# Patient Record
Sex: Female | Born: 1955 | Race: Black or African American | Hispanic: No | Marital: Married | Smoking: Former smoker
Health system: Southern US, Community
[De-identification: ages and names within clinical notes are randomized; demographics above are authoritative.]

## PROBLEM LIST (undated history)

## (undated) DIAGNOSIS — F32A Depression, unspecified: Secondary | ICD-10-CM

## (undated) DIAGNOSIS — I10 Essential (primary) hypertension: Secondary | ICD-10-CM

## (undated) DIAGNOSIS — G4730 Sleep apnea, unspecified: Secondary | ICD-10-CM

## (undated) DIAGNOSIS — F329 Major depressive disorder, single episode, unspecified: Secondary | ICD-10-CM

## (undated) DIAGNOSIS — E039 Hypothyroidism, unspecified: Secondary | ICD-10-CM

## (undated) DIAGNOSIS — E785 Hyperlipidemia, unspecified: Secondary | ICD-10-CM

## (undated) DIAGNOSIS — R0602 Shortness of breath: Secondary | ICD-10-CM

## (undated) DIAGNOSIS — Z9889 Other specified postprocedural states: Secondary | ICD-10-CM

## (undated) DIAGNOSIS — M1990 Unspecified osteoarthritis, unspecified site: Secondary | ICD-10-CM

## (undated) DIAGNOSIS — R112 Nausea with vomiting, unspecified: Secondary | ICD-10-CM

## (undated) DIAGNOSIS — J449 Chronic obstructive pulmonary disease, unspecified: Secondary | ICD-10-CM

## (undated) HISTORY — PX: LAPAROSCOPIC GASTRIC BANDING: SHX1100

## (undated) HISTORY — DX: Hyperlipidemia, unspecified: E78.5

## (undated) HISTORY — DX: Essential (primary) hypertension: I10

## (undated) HISTORY — PX: KNEE SURGERY: SHX244

## (undated) HISTORY — PX: TUBAL LIGATION: SHX77

## (undated) HISTORY — PX: EYE SURGERY: SHX253

---

## 2009-06-16 ENCOUNTER — Ambulatory Visit (HOSPITAL_COMMUNITY): Admission: RE | Admit: 2009-06-16 | Discharge: 2009-06-16 | Payer: Self-pay | Admitting: Surgery

## 2009-06-23 ENCOUNTER — Ambulatory Visit (HOSPITAL_COMMUNITY): Admission: RE | Admit: 2009-06-23 | Discharge: 2009-06-23 | Payer: Self-pay | Admitting: Surgery

## 2009-12-12 ENCOUNTER — Encounter: Admission: RE | Admit: 2009-12-12 | Discharge: 2010-01-21 | Payer: Self-pay | Admitting: Surgery

## 2010-01-19 ENCOUNTER — Ambulatory Visit (HOSPITAL_COMMUNITY): Admission: RE | Admit: 2010-01-19 | Discharge: 2010-01-20 | Payer: Self-pay | Admitting: Surgery

## 2010-02-02 ENCOUNTER — Encounter
Admission: RE | Admit: 2010-02-02 | Discharge: 2010-02-02 | Payer: Self-pay | Source: Home / Self Care | Attending: Surgery | Admitting: Surgery

## 2010-03-16 ENCOUNTER — Encounter
Admission: RE | Admit: 2010-03-16 | Discharge: 2010-05-25 | Payer: Self-pay | Source: Home / Self Care | Attending: Surgery | Admitting: Surgery

## 2010-05-31 ENCOUNTER — Encounter: Payer: Medicare Other | Attending: Surgery | Admitting: *Deleted

## 2010-05-31 DIAGNOSIS — Z9884 Bariatric surgery status: Secondary | ICD-10-CM | POA: Insufficient documentation

## 2010-05-31 DIAGNOSIS — Z09 Encounter for follow-up examination after completed treatment for conditions other than malignant neoplasm: Secondary | ICD-10-CM | POA: Insufficient documentation

## 2010-05-31 DIAGNOSIS — Z713 Dietary counseling and surveillance: Secondary | ICD-10-CM | POA: Insufficient documentation

## 2010-07-08 LAB — DIFFERENTIAL
Basophils Absolute: 0 10*3/uL (ref 0.0–0.1)
Eosinophils Absolute: 0.1 10*3/uL (ref 0.0–0.7)
Eosinophils Relative: 0 % (ref 0–5)
Eosinophils Relative: 2 % (ref 0–5)
Lymphocytes Relative: 10 % — ABNORMAL LOW (ref 12–46)
Lymphs Abs: 0.8 10*3/uL (ref 0.7–4.0)
Lymphs Abs: 1.7 10*3/uL (ref 0.7–4.0)
Monocytes Absolute: 0.4 10*3/uL (ref 0.1–1.0)
Monocytes Absolute: 0.5 10*3/uL (ref 0.1–1.0)
Monocytes Relative: 6 % (ref 3–12)
Monocytes Relative: 8 % (ref 3–12)

## 2010-07-08 LAB — COMPREHENSIVE METABOLIC PANEL
AST: 20 U/L (ref 0–37)
Albumin: 3.9 g/dL (ref 3.5–5.2)
CO2: 27 mEq/L (ref 19–32)
Calcium: 9.6 mg/dL (ref 8.4–10.5)
Creatinine, Ser: 0.77 mg/dL (ref 0.4–1.2)
Glucose, Bld: 332 mg/dL — ABNORMAL HIGH (ref 70–99)

## 2010-07-08 LAB — CBC
HCT: 33.8 % — ABNORMAL LOW (ref 36.0–46.0)
HCT: 37.2 % (ref 36.0–46.0)
Hemoglobin: 11.3 g/dL — ABNORMAL LOW (ref 12.0–15.0)
Hemoglobin: 12.3 g/dL (ref 12.0–15.0)
MCH: 28.9 pg (ref 26.0–34.0)
MCV: 87.4 fL (ref 78.0–100.0)
RBC: 3.91 MIL/uL (ref 3.87–5.11)
RDW: 15.1 % (ref 11.5–15.5)
WBC: 5.3 10*3/uL (ref 4.0–10.5)

## 2010-07-08 LAB — GLUCOSE, CAPILLARY
Glucose-Capillary: 229 mg/dL — ABNORMAL HIGH (ref 70–99)
Glucose-Capillary: 253 mg/dL — ABNORMAL HIGH (ref 70–99)

## 2010-07-08 LAB — PREGNANCY, URINE: Preg Test, Ur: NEGATIVE

## 2010-07-08 LAB — SURGICAL PCR SCREEN: Staphylococcus aureus: NEGATIVE

## 2010-08-02 ENCOUNTER — Encounter: Payer: Medicare Other | Attending: Surgery | Admitting: *Deleted

## 2010-08-02 DIAGNOSIS — Z09 Encounter for follow-up examination after completed treatment for conditions other than malignant neoplasm: Secondary | ICD-10-CM | POA: Insufficient documentation

## 2010-08-02 DIAGNOSIS — Z9884 Bariatric surgery status: Secondary | ICD-10-CM | POA: Insufficient documentation

## 2010-08-02 DIAGNOSIS — Z713 Dietary counseling and surveillance: Secondary | ICD-10-CM | POA: Insufficient documentation

## 2010-09-21 ENCOUNTER — Encounter (INDEPENDENT_AMBULATORY_CARE_PROVIDER_SITE_OTHER): Payer: Self-pay | Admitting: Surgery

## 2010-11-01 ENCOUNTER — Encounter: Payer: Self-pay | Admitting: *Deleted

## 2010-11-01 ENCOUNTER — Encounter: Payer: Medicare Other | Attending: Surgery | Admitting: *Deleted

## 2010-11-01 DIAGNOSIS — Z09 Encounter for follow-up examination after completed treatment for conditions other than malignant neoplasm: Secondary | ICD-10-CM | POA: Insufficient documentation

## 2010-11-01 DIAGNOSIS — Z9884 Bariatric surgery status: Secondary | ICD-10-CM | POA: Insufficient documentation

## 2010-11-01 DIAGNOSIS — Z713 Dietary counseling and surveillance: Secondary | ICD-10-CM | POA: Insufficient documentation

## 2010-11-01 NOTE — Progress Notes (Signed)
Follow-up visit: 10 Months Post-Operative LAGB Surgery  Medical Nutrition Therapy:  Appt start time: 9:00am; Appt end time:  9:30am  Assessment:  Primary concerns today: post-operative bariatric surgery nutrition management.  Weight today: 275.8lbs Weight change: 7.4lbs lost Total weight lost: 32.3 lbs lost total BMI: 39.6%  24-hr recall:  B : SKIPS, related to limited appetite Snk: SKIPS, related to limited appetite  L (1PM): Hot dog with whole wheat bun (2) Snk: SKIPS, related to limited appetite D:  SKIPS, related to limited appetite Snk (8-11:30 PM): Sherbet (2-3 cups) or Sugar-free popsicles   Fluid intake: Iced coffee (with sugar), iced tea (diet), water = 20oz total  Estimated total protein intake: 30-40g  Medications: Wellbutrin, Simvastatin, Lantus, Metformin, Enanapril, Aspirin Supplementation: No supplementation reported CBG monitoring: 1 time per day Average CBG per patient: >200 Last patient reported A1c: >13  Using straws:None reported Drinking while eating: With every meal Hair loss: None reported Carbonated beverages: None reported N/V/D/C: Vomiting reported secondary to poor chewing habits, eating too fast Dumping syndrome:N/A Last LAGB fill: >3 months ago Recent physical activity: Limited related feeling "extremely fatigued" and "too hot"  Progress Towards Goal(s): No progress reported.   Nutritional Diagnosis:  Disordered eating pattern related to frequent meal skipping and excessive late night snacking as evidenced by patient consuming ~1 meal per day.     Intervention:    Discontinue drinking while eating  Increase protein to 80g/day using lean meats, beans, and protein supplements  Increase fluid to 64oz (sugar-free, caffeine-free, non-carbonated)  Increase exercise levels to 2-3 times per week  Avoid concentrated sweets  Eat 3 meals/day  Restart Pre-Op Diet  Monitoring/Evaluation:  Dietary intake, exercise, lap band fills, and body  weight. Follow up in 2 months for 12 month post-op visit.

## 2010-11-01 NOTE — Patient Instructions (Addendum)
New patient set goals:  Avoid drinking while eating  Increase protein (80g) and fluid (64oz) to goal  Increase exercise levels  Avoid concentrated sweets  Eat 3 meals/day  Restart Pre-Op Diet  Follow up in 2 months for 12 month post-op visit.

## 2010-12-03 ENCOUNTER — Encounter (INDEPENDENT_AMBULATORY_CARE_PROVIDER_SITE_OTHER): Payer: Self-pay

## 2010-12-31 ENCOUNTER — Encounter (INDEPENDENT_AMBULATORY_CARE_PROVIDER_SITE_OTHER): Payer: Self-pay

## 2010-12-31 ENCOUNTER — Ambulatory Visit (INDEPENDENT_AMBULATORY_CARE_PROVIDER_SITE_OTHER): Payer: Medicare Other | Admitting: Physician Assistant

## 2010-12-31 DIAGNOSIS — R1310 Dysphagia, unspecified: Secondary | ICD-10-CM

## 2010-12-31 NOTE — Patient Instructions (Signed)
Obtain x-ray then follow-up with me in 2 weeks.

## 2010-12-31 NOTE — Progress Notes (Signed)
  HISTORY: Nicole Bridges is a 55 y.o.female who received an AP-Large lap-band in September 2011 by Dr. Daphine Deutscher. She was last seen in April and has gained five pounds. She complains of extreme hunger at night but is eating a diet of slider foods as she has an intolerance of solids at any time of day.  VITAL SIGNS: Filed Vitals:   12/31/10 1027  BP: 134/76  Pulse: 88    PHYSICAL EXAM: Physical exam reveals a very well-appearing 55 y.o.female in no apparent distress Neurologic: Awake, alert, oriented Psych: Bright affect, conversant Respiratory: Breathing even and unlabored. No stridor or wheezing Extremities: Atraumatic, good range of motion. Skin: Warm, Dry, no rashes Musculoskeletal: Normal gait, Joints normal  ASSESMENT: 55 y.o.  female  s/p AP-Large lap-band.   PLAN: I've scheduled her for an upper GI next week with follow-up after her study.

## 2011-01-03 ENCOUNTER — Ambulatory Visit
Admission: RE | Admit: 2011-01-03 | Discharge: 2011-01-03 | Disposition: A | Discharge status: Home / Self Care | Payer: Medicare Other | Source: Ambulatory Visit | Attending: Physician Assistant | Admitting: Physician Assistant

## 2011-01-03 ENCOUNTER — Encounter: Payer: Self-pay | Admitting: *Deleted

## 2011-01-03 ENCOUNTER — Encounter: Payer: Medicare Other | Attending: Surgery | Admitting: *Deleted

## 2011-01-03 DIAGNOSIS — Z09 Encounter for follow-up examination after completed treatment for conditions other than malignant neoplasm: Secondary | ICD-10-CM | POA: Insufficient documentation

## 2011-01-03 DIAGNOSIS — Z9884 Bariatric surgery status: Secondary | ICD-10-CM | POA: Insufficient documentation

## 2011-01-03 DIAGNOSIS — Z713 Dietary counseling and surveillance: Secondary | ICD-10-CM | POA: Insufficient documentation

## 2011-01-03 NOTE — Progress Notes (Signed)
  Follow-up visit: 12 Months Post-Operative LAGB Surgery  Medical Nutrition Therapy:  Appt start time: 0900 end time:  0930.  Assessment:  Primary concerns today: post-operative bariatric surgery nutrition management. Pt reports that she has not been able to get a fill recently due to the fact that her co-pay for fills is too high. On another note she states that many foods "get stuck" and "come back up". When discussing meals, Nicole Bridges reports that she doesn't chew well and eats fairly fast. Nicole Bridges is upset with her weight gain today but feels its related to her band not working right. She went to have any x-ray today to see if the band had slipped yet per Nicole Bridges she reports that everything is okay. Nicole Bridges reports that she is severely depressed today. She says her family doesn't understand and it is interfering with her quality of life. She reports no suicidal ideations.  Weight today: 291.6 lbs Weight change: 16.6 lbs gain Total weight lost: No net weight loss BMI: 41.9% Weight goal: 150 % Weight goal met: 0%  24-hr recall:  B (7-8 AM): Skips Snk (AM): Skips OR Glucerna Shake  L (2 PM): 2 Hot Dogs (w/ bun), baked beans Snk (3-4 PM): Cucumbers w/ Low-fat Ranch D (4-5 PM): Skips Snk (8-10 PM): Cheese, crackers, Popsicles, Ice Cream (mindless eating)  Fluid intake: 45-50 oz (Pepsi, Juices, Water, Diabetes Shakes) Estimated total protein intake: 40-50  Medications: No medication changes (See list) Supplementation: Pt is not taking medications regularly  CBG monitoring: Checking once/day Average CBG per patient: 200 + Last patient reported A1c: 13% (per pt on 10/2010) No results found for this basename: HGBA1C   Using straws: Yes Drinking while eating: "Yes, I have to or food will get stuck" Hair loss: Yes Carbonated beverages: Yes N/V/D/C: Yes Last Lap-Band fill: >3 months ago  Recent physical activity:  No. When asked about exercise, pt reports that she is depressed because she is  not working, does not have any friends, and doesn't have any reason to exercise.  Progress Towards Goal(s):  In progress.  Nutritional Diagnosis:  [place]-3.3 Overweight/obesity As related to excessive carbohydrate intake.  As evidenced by pt consuming excessive amounts of sugar-sweetened beverages, starchy foods, and sweets.    Intervention:  Nutrition education.  Monitoring/Evaluation:  Dietary intake, exercise, lap band fills, and body weight. Follow up in 3 months for 15 month post-op visit.

## 2011-01-03 NOTE — Patient Instructions (Addendum)
Goals:  Follow Phase 3B: High Protein + Non-Starchy Vegetables  Restart Pre-Op Diet  Avoid Meal Skipping  Avoid sugar-sweetened beverages  Eat 3-6 small meals/snacks, every 3-5 hrs  Increase lean protein foods to meet 60-80g goal  Increase fluid intake to 64oz +  Avoid drinking 15 minutes before, during and 30 minutes after eating  Aim for >30 min of physical activity daily  *Referred pt back to PCP regarding depression.

## 2011-01-14 ENCOUNTER — Encounter (INDEPENDENT_AMBULATORY_CARE_PROVIDER_SITE_OTHER): Payer: Medicare Other

## 2011-01-14 ENCOUNTER — Ambulatory Visit (INDEPENDENT_AMBULATORY_CARE_PROVIDER_SITE_OTHER): Payer: Medicare Other | Admitting: Physician Assistant

## 2011-01-14 ENCOUNTER — Encounter (INDEPENDENT_AMBULATORY_CARE_PROVIDER_SITE_OTHER): Payer: Self-pay

## 2011-01-14 NOTE — Patient Instructions (Signed)
Follow-up with Dr. Daphine Deutscher and your primary care physician as scheduled.

## 2011-01-14 NOTE — Progress Notes (Signed)
  HISTORY: Nicole Bridges is a 55 y.o.female who received an AP-Large lap-band in September 2011 by Dr. Daphine Deutscher. She comes in for follow-up after her Upper GI last week. We reviewed the study results as well as the images which were completely normal. She also met with Nicole Bridges, LD to go over her diet choices, which have been poor at best. She also appears to have some family/social stressors which are not helping matters. She has an appointment with Dr. Nelson Chimes to address some of these problems.  VITAL SIGNS: Filed Vitals:   01/14/11 1338  BP: 124/82  Pulse: 76  Temp: 98.4 F (36.9 C)  Resp: 18    PHYSICAL EXAM: Physical exam reveals a very well-appearing 55 y.o.female in no apparent distress Neurologic: Awake, alert, oriented Psych: Bright affect, conversant Respiratory: Breathing even and unlabored. No stridor or wheezing Extremities: Atraumatic, good range of motion. Skin: Warm, Dry, no rashes Musculoskeletal: Normal gait, Joints normal  ASSESMENT: 55 y.o.  female  s/p AP-Large lap-band.   PLAN: From a surgical standpoint, she appears to be in good shape. The band is functioning as expected. Most of her issues appear to be with regard to food choices. A significant barrier appears to be motivation to make good choices. She has an appointment with her primary physician in early October. We'll set her up with Dr. Daphine Deutscher next month as well.

## 2011-02-25 ENCOUNTER — Ambulatory Visit
Admission: RE | Admit: 2011-02-25 | Discharge: 2011-02-25 | Disposition: A | Discharge status: Home / Self Care | Payer: Medicare Other | Source: Ambulatory Visit | Attending: Surgery | Admitting: Surgery

## 2011-02-25 ENCOUNTER — Encounter (INDEPENDENT_AMBULATORY_CARE_PROVIDER_SITE_OTHER): Payer: Self-pay | Admitting: Surgery

## 2011-02-25 ENCOUNTER — Ambulatory Visit (INDEPENDENT_AMBULATORY_CARE_PROVIDER_SITE_OTHER): Payer: Medicare Other | Admitting: Surgery

## 2011-02-25 VITALS — BP 130/74 | HR 78 | Temp 97.2°F | Resp 20 | Ht 69.0 in | Wt 292.8 lb

## 2011-02-25 DIAGNOSIS — Z9884 Bariatric surgery status: Secondary | ICD-10-CM

## 2011-02-25 NOTE — Patient Instructions (Signed)
Get abdominal xray.  Followup with Dr. Daphine Deutscher for reattempt at band fill or band port revision.

## 2011-02-25 NOTE — Progress Notes (Signed)
Nicole Bridges is 13 months out from her lap band APL with repair of her hiatal hernia. So far she has lost 7.2 pounds from her preoperative weight.she hasn't had a band fill since March. I attempted to access her port multiple times and in both the supine and dense upright positions. I got into a cicatrix and at one point had trouble with his pulling the needle out. Either the band was rotated or has developed a dense scar around it. I'll send her over for a x-ray of report and then will see her back next week to try to fill her based on what we see.

## 2011-03-11 ENCOUNTER — Other Ambulatory Visit (INDEPENDENT_AMBULATORY_CARE_PROVIDER_SITE_OTHER): Payer: Self-pay | Admitting: Physician Assistant

## 2011-03-11 ENCOUNTER — Encounter (INDEPENDENT_AMBULATORY_CARE_PROVIDER_SITE_OTHER): Payer: Medicare Other

## 2011-03-11 ENCOUNTER — Ambulatory Visit (INDEPENDENT_AMBULATORY_CARE_PROVIDER_SITE_OTHER): Payer: Medicare Other | Admitting: Physician Assistant

## 2011-03-11 ENCOUNTER — Ambulatory Visit
Admission: RE | Admit: 2011-03-11 | Discharge: 2011-03-11 | Disposition: A | Discharge status: Home / Self Care | Payer: Medicare Other | Source: Ambulatory Visit | Attending: Physician Assistant | Admitting: Physician Assistant

## 2011-03-11 ENCOUNTER — Encounter (INDEPENDENT_AMBULATORY_CARE_PROVIDER_SITE_OTHER): Payer: Self-pay

## 2011-03-11 DIAGNOSIS — Z9884 Bariatric surgery status: Secondary | ICD-10-CM

## 2011-03-11 NOTE — Patient Instructions (Signed)
Obtain x-ray at your convenience then follow-up with Dr. Daphine Deutscher.

## 2011-03-11 NOTE — Progress Notes (Signed)
  HISTORY: Nicole Bridges is a 55 y.o.female who received an AP-Large lap-band in September 2011 by Dr. Daphine Deutscher. She was seen 2 weeks ago by Dr. Daphine Deutscher who attempted to access her port without success. A 1-view abdomen xray was obtained which showed no clearcut evidence of port movement. She has no new complaints but says she's eating more than she would like. Fortunately she's lost 12 lbs since being seen by Dr. Daphine Deutscher.  VITAL SIGNS: Filed Vitals:   03/11/11 0949  BP: 128/84  Pulse: 80  Resp: 18    PHYSICAL EXAM: Physical exam reveals a very well-appearing 55 y.o.female in no apparent distress Neurologic: Awake, alert, oriented Psych: Bright affect, conversant Respiratory: Breathing even and unlabored. No stridor or wheezing Extremities: Atraumatic, good range of motion. Skin: Warm, Dry, no rashes Musculoskeletal: Normal gait, Joints normal  ASSESMENT: 55 y.o.  female  s/p AP-Large lap-band.   PLAN: I attempted without success to access her port. It sits just beneath the skin so finding landmarks was not a significant issue. I'm concerned that her port has flipped 180 degrees. I reviewed the patient with Dr. Biagio Quint who recommended a lateral view as a 180 degree rotation does not always give an unusual appearance on AP films. I've scheduled the study and have asked her to see Dr. Daphine Deutscher following that.

## 2011-03-15 ENCOUNTER — Ambulatory Visit (INDEPENDENT_AMBULATORY_CARE_PROVIDER_SITE_OTHER): Payer: Medicare Other | Admitting: Surgery

## 2011-03-15 VITALS — BP 122/72 | HR 84 | Temp 98.2°F | Resp 18 | Ht 69.0 in | Wt 285.0 lb

## 2011-03-15 DIAGNOSIS — K9509 Other complications of gastric band procedure: Secondary | ICD-10-CM

## 2011-03-15 NOTE — Progress Notes (Signed)
Nicole Bridges  comes in today with difficulty accessing her port. When I saw her after her surgery she has lost her I from an accident. I was able to access it early on but recently and he has had trouble as as I had today. I think he has rotated to we will schedule her for changing of her band port under general. They are from [place].   Updated H P She is having no GERD.  Past medical history is remarkable in that she had her lap band APL with hiatal hernia repair September 2011.  Allergies no known allergies.  Medications include her diabetes medicine Humalog Lantus metformin as well as Lexapro Advair trazodone Advair.  Family history both her parents are deceased had diabetes. PE:  Height 5 feet 10 inches today's weight 285 HEENT exam she's had loss of her right.  Neckt supple chest clear  Heart sounds without murmurs gallops  Abdomen is her port site to the right of midline.  Extremities full range of motion. No history of DVT.  Impression LAP-BAND port dysfunction plan revision of lap band.

## 2011-03-16 ENCOUNTER — Other Ambulatory Visit (INDEPENDENT_AMBULATORY_CARE_PROVIDER_SITE_OTHER): Payer: Self-pay | Admitting: Surgery

## 2011-03-22 ENCOUNTER — Encounter (HOSPITAL_COMMUNITY): Payer: Self-pay | Admitting: Pharmacy Technician

## 2011-03-25 ENCOUNTER — Other Ambulatory Visit (INDEPENDENT_AMBULATORY_CARE_PROVIDER_SITE_OTHER): Payer: Self-pay | Admitting: General Surgery

## 2011-03-25 ENCOUNTER — Telehealth (INDEPENDENT_AMBULATORY_CARE_PROVIDER_SITE_OTHER): Payer: Self-pay | Admitting: General Surgery

## 2011-03-25 NOTE — Telephone Encounter (Signed)
Patient needs to have orders for surgery put in epic

## 2011-03-28 ENCOUNTER — Encounter (HOSPITAL_COMMUNITY): Payer: Self-pay

## 2011-03-28 ENCOUNTER — Other Ambulatory Visit: Payer: Self-pay

## 2011-03-28 ENCOUNTER — Other Ambulatory Visit (INDEPENDENT_AMBULATORY_CARE_PROVIDER_SITE_OTHER): Payer: Self-pay | Admitting: Surgery

## 2011-03-28 ENCOUNTER — Ambulatory Visit (HOSPITAL_COMMUNITY)
Admission: RE | Admit: 2011-03-28 | Discharge: 2011-03-28 | Disposition: A | Discharge status: Home / Self Care | Payer: Medicare Other | Source: Ambulatory Visit | Attending: Surgery | Admitting: Surgery

## 2011-03-28 ENCOUNTER — Inpatient Hospital Stay (HOSPITAL_COMMUNITY)
Admission: RE | Admit: 2011-03-28 | Discharge: 2011-03-28 | Disposition: A | Discharge status: Home / Self Care | Payer: Medicare Other | Source: Ambulatory Visit | Attending: Surgery | Admitting: Surgery

## 2011-03-28 HISTORY — DX: Shortness of breath: R06.02

## 2011-03-28 HISTORY — DX: Unspecified osteoarthritis, unspecified site: M19.90

## 2011-03-28 HISTORY — DX: Hypothyroidism, unspecified: E03.9

## 2011-03-28 HISTORY — DX: Major depressive disorder, single episode, unspecified: F32.9

## 2011-03-28 HISTORY — DX: Nausea with vomiting, unspecified: R11.2

## 2011-03-28 HISTORY — DX: Depression, unspecified: F32.A

## 2011-03-28 HISTORY — DX: Chronic obstructive pulmonary disease, unspecified: J44.9

## 2011-03-28 HISTORY — DX: Other specified postprocedural states: Z98.890

## 2011-03-28 HISTORY — DX: Sleep apnea, unspecified: G47.30

## 2011-03-28 LAB — BASIC METABOLIC PANEL
BUN: 13 mg/dL (ref 6–23)
CO2: 28 mEq/L (ref 19–32)
Calcium: 9.7 mg/dL (ref 8.4–10.5)
Creatinine, Ser: 0.67 mg/dL (ref 0.50–1.10)

## 2011-03-28 LAB — SURGICAL PCR SCREEN
MRSA, PCR: NEGATIVE
Staphylococcus aureus: NEGATIVE

## 2011-03-28 LAB — CBC
HCT: 36.9 % (ref 36.0–46.0)
Hemoglobin: 12.2 g/dL (ref 12.0–15.0)
MCH: 28 pg (ref 26.0–34.0)
MCV: 84.6 fL (ref 78.0–100.0)
Platelets: 233 10*3/uL (ref 150–400)
WBC: 5 10*3/uL (ref 4.0–10.5)

## 2011-03-28 NOTE — Pre-Procedure Instructions (Signed)
EKG AND CXR WERE DONE PREOP TODAY AT [facility]

## 2011-03-28 NOTE — Patient Instructions (Signed)
20 Nicole Bridges  03/28/2011   Your procedure is scheduled on:  WED 12/5  AT 12:50 PM  Report to [facility] at 10:45 AM.  Call this number if you have problems the morning of surgery: [phone]   Remember:ONLY TAKE 1/2 YOUR USUAL BEDTIME INSULIN NIGHT BEFORE YOUR SURGERY.  DO NOT TAKE ANY INSULIN  OR OTHER DIABETIC MEDICATIONS THE AM OF YOUR SURGERY.   Do not eat food OR DRINK ANYTHING After Midnight. THE NIGHT BEFORE YOUR SURGERY      Take these medicines the morning of surgery with A SIP OF WATER: WELLBUTRIN, GABAPENTIN, LEVOTHYROXINE AND LEXAPRO   Do not wear jewelry, make-up or nail polish.  Do not wear lotions, powders, or perfumes. You may wear deodorant.  Do not shave 48 hours prior to surgery.  Do not bring valuables to the hospital.  Contacts, dentures or bridgework may not be worn into surgery.  Leave suitcase in the car. After surgery it may be brought to your room.  For patients admitted to the hospital, checkout time is 11:00 AM the day of discharge.   Patients discharged the day of surgery will not be allowed to drive home.  Name and phone number of your driver:   Special Instructions: CHG Shower Use Special Wash: 1/2 bottle night before surgery and 1/2 bottle morning of surgery.   Please read over the following fact sheets that you were given: MRSA Information

## 2011-03-29 NOTE — H&P (Addendum)
  Nicole Merino, MD 03/15/2011 3:59 PM Signed  Nicole Bridges comes in today with difficulty accessing her port. When I saw her after her surgery she has lost her  eye from an accident. I was able to access her port  early on but recently and he has had trouble as as I had today. I think he has rotated to we will schedule her for changing of her band port under general. They are from [place]. I explained this to her and her accompanying family.   Updated H P  She is having no GERD since her lapband and hiatal hernia repair.  Past medical history is remarkable in that she had her lap band APL with hiatal hernia repair September 2011.  Allergies no known allergies.  Medications include her diabetes medicine Humalog Lantus metformin as well as Lexapro Advair trazodone Advair.  Family history both her parents are deceased had diabetes.  PE: Height 5 feet 10 inches today's weight 285 HEENT exam she's had loss of her right.  Nect supple chest clear  Heart sounds without murmurs gallops  Abdomen is her port site to the right of midline. Unable to access Extremities full range of motion. No history of DVT.    Impression LAP-BAND port dysfunction Plan revision of lap band port only under general.  The patient understands and is aware of indications and risks   Matt B. Daphine Deutscher, MD, [facility] [phone] beeper [phone]  03/29/2011 9:42 PM There has been no change in the patient's past medical history or physical exam in the past 24 hours to the best of my knowledge.  Expectations and outcome results have been discussed with the patient to include risks and benefits.  All questions have been answered and will proceed with previously discussed procedure noted and signed in the consent form in the patient's record.    Daveon Arpino BMD @NOW  03/30/2011

## 2011-03-30 ENCOUNTER — Inpatient Hospital Stay (HOSPITAL_COMMUNITY)
Admission: RE | Admit: 2011-03-30 | Discharge: 2011-03-31 | DRG: 989 | Disposition: A | Discharge status: Home / Self Care | Payer: Medicare Other | Source: Ambulatory Visit | Attending: Surgery | Admitting: Surgery

## 2011-03-30 ENCOUNTER — Inpatient Hospital Stay (HOSPITAL_COMMUNITY): Payer: Medicare Other | Admitting: Certified Registered Nurse Anesthetist

## 2011-03-30 ENCOUNTER — Encounter (HOSPITAL_COMMUNITY): Payer: Self-pay | Admitting: Certified Registered Nurse Anesthetist

## 2011-03-30 ENCOUNTER — Encounter (HOSPITAL_COMMUNITY)
Admission: RE | Disposition: A | Discharge status: Home / Self Care | Payer: Self-pay | Source: Ambulatory Visit | Attending: Surgery

## 2011-03-30 ENCOUNTER — Encounter (HOSPITAL_COMMUNITY): Payer: Self-pay | Admitting: *Deleted

## 2011-03-30 DIAGNOSIS — J4489 Other specified chronic obstructive pulmonary disease: Secondary | ICD-10-CM | POA: Diagnosis present

## 2011-03-30 DIAGNOSIS — Z9001 Acquired absence of eye: Secondary | ICD-10-CM

## 2011-03-30 DIAGNOSIS — Y831 Surgical operation with implant of artificial internal device as the cause of abnormal reaction of the patient, or of later complication, without mention of misadventure at the time of the procedure: Secondary | ICD-10-CM | POA: Diagnosis present

## 2011-03-30 DIAGNOSIS — Z794 Long term (current) use of insulin: Secondary | ICD-10-CM

## 2011-03-30 DIAGNOSIS — E119 Type 2 diabetes mellitus without complications: Secondary | ICD-10-CM | POA: Diagnosis present

## 2011-03-30 DIAGNOSIS — J449 Chronic obstructive pulmonary disease, unspecified: Secondary | ICD-10-CM | POA: Diagnosis present

## 2011-03-30 DIAGNOSIS — G4730 Sleep apnea, unspecified: Secondary | ICD-10-CM | POA: Diagnosis present

## 2011-03-30 DIAGNOSIS — T85898A Other specified complication of other internal prosthetic devices, implants and grafts, initial encounter: Secondary | ICD-10-CM

## 2011-03-30 DIAGNOSIS — Z79899 Other long term (current) drug therapy: Secondary | ICD-10-CM

## 2011-03-30 DIAGNOSIS — K9509 Other complications of gastric band procedure: Principal | ICD-10-CM | POA: Diagnosis present

## 2011-03-30 DIAGNOSIS — Z833 Family history of diabetes mellitus: Secondary | ICD-10-CM

## 2011-03-30 DIAGNOSIS — Z9884 Bariatric surgery status: Secondary | ICD-10-CM

## 2011-03-30 DIAGNOSIS — I10 Essential (primary) hypertension: Secondary | ICD-10-CM | POA: Diagnosis present

## 2011-03-30 HISTORY — PX: GASTRIC BANDING PORT REVISION: SHX5246

## 2011-03-30 LAB — GLUCOSE, CAPILLARY
Glucose-Capillary: 87 mg/dL (ref 70–99)
Glucose-Capillary: 47 mg/dL — ABNORMAL LOW (ref 70–99)
Glucose-Capillary: 50 mg/dL — ABNORMAL LOW (ref 70–99)
Glucose-Capillary: 52 mg/dL — ABNORMAL LOW (ref 70–99)
Glucose-Capillary: 81 mg/dL (ref 70–99)

## 2011-03-30 LAB — CBC
MCH: 28.4 pg (ref 26.0–34.0)
MCHC: 33.1 g/dL (ref 30.0–36.0)
RDW: 14 % (ref 11.5–15.5)

## 2011-03-30 LAB — CREATININE, SERUM
Creatinine, Ser: 0.78 mg/dL (ref 0.50–1.10)
GFR calc non Af Amer: >90 mL/min (ref >90)

## 2011-03-30 SURGERY — GASTRIC BANDING PORT REVISION
Anesthesia: General | Site: Abdomen

## 2011-03-30 SURGERY — GASTRIC BANDING PORT REVISION
Anesthesia: General

## 2011-03-30 MED ORDER — HEPARIN SODIUM (PORCINE) 5000 UNIT/ML IJ SOLN
5000.0000 [IU] | Freq: Three times a day (TID) | INTRAMUSCULAR | Status: DC
  Administered 2011-03-30 – 2011-03-31 (×2): 5000 [IU] via SUBCUTANEOUS
  Filled 2011-03-30 (×8): qty 1

## 2011-03-30 MED ORDER — FENTANYL CITRATE 0.05 MG/ML IJ SOLN
25.0000 ug | INTRAMUSCULAR | Status: DC | PRN
  Administered 2011-03-30: 25 ug via INTRAVENOUS

## 2011-03-30 MED ORDER — KCL IN DEXTROSE-NACL 20-5-0.45 MEQ/L-%-% IV SOLN
INTRAVENOUS | Status: DC
  Administered 2011-03-30: 23:00:00 via INTRAVENOUS
  Filled 2011-03-30 (×4): qty 1000

## 2011-03-30 MED ORDER — ONDANSETRON HCL 4 MG/2ML IJ SOLN
INTRAMUSCULAR | Status: DC | PRN
  Administered 2011-03-30: 4 mg via INTRAVENOUS

## 2011-03-30 MED ORDER — DIPHENHYDRAMINE HCL 50 MG/ML IJ SOLN: 12.5000 mg | Freq: Four times a day (QID) | INTRAMUSCULAR | Status: DC | PRN

## 2011-03-30 MED ORDER — MIDAZOLAM HCL 5 MG/5ML IJ SOLN
INTRAMUSCULAR | Status: DC | PRN
  Administered 2011-03-30: 2 mg via INTRAVENOUS

## 2011-03-30 MED ORDER — LIDOCAINE HCL 1 % IJ SOLN
INTRAMUSCULAR | Status: DC | PRN
  Administered 2011-03-30: 50 mg via INTRADERMAL

## 2011-03-30 MED ORDER — FENTANYL CITRATE 0.05 MG/ML IJ SOLN
INTRAMUSCULAR | Status: DC | PRN
  Administered 2011-03-30: 50 ug via INTRAVENOUS
  Administered 2011-03-30: 100 ug via INTRAVENOUS

## 2011-03-30 MED ORDER — BUPIVACAINE LIPOSOME 1.3 % IJ SUSP
20.0000 mL | INTRAMUSCULAR | Status: AC
  Administered 2011-03-30: 20 mL
  Filled 2011-03-30: qty 20

## 2011-03-30 MED ORDER — CEFAZOLIN SODIUM-DEXTROSE 2-3 GM-% IV SOLR
INTRAVENOUS | Status: AC
  Filled 2011-03-30: qty 50

## 2011-03-30 MED ORDER — PROMETHAZINE HCL 25 MG/ML IJ SOLN: 6.2500 mg | INTRAMUSCULAR | Status: DC | PRN

## 2011-03-30 MED ORDER — FENTANYL CITRATE 0.05 MG/ML IJ SOLN
INTRAMUSCULAR | Status: AC
  Filled 2011-03-30: qty 2

## 2011-03-30 MED ORDER — LACTATED RINGERS IV SOLN
INTRAVENOUS | Status: DC | PRN
  Administered 2011-03-30 (×2): via INTRAVENOUS

## 2011-03-30 MED ORDER — DIPHENHYDRAMINE HCL 12.5 MG/5ML PO ELIX: 12.5000 mg | ORAL_SOLUTION | Freq: Four times a day (QID) | ORAL | Status: DC | PRN

## 2011-03-30 MED ORDER — PANTOPRAZOLE SODIUM 40 MG IV SOLR
40.0000 mg | Freq: Every day | INTRAVENOUS | Status: DC
  Administered 2011-03-30: 40 mg via INTRAVENOUS
  Filled 2011-03-30 (×3): qty 40

## 2011-03-30 MED ORDER — DEXAMETHASONE SODIUM PHOSPHATE 10 MG/ML IJ SOLN
INTRAMUSCULAR | Status: DC | PRN
  Administered 2011-03-30: 10 mg via INTRAVENOUS

## 2011-03-30 MED ORDER — HEPARIN SODIUM (PORCINE) 5000 UNIT/ML IJ SOLN
5000.0000 [IU] | Freq: Once | INTRAMUSCULAR | Status: AC
  Administered 2011-03-30: 5000 [IU] via SUBCUTANEOUS

## 2011-03-30 MED ORDER — LACTATED RINGERS IV SOLN
INTRAVENOUS | Status: DC
  Administered 2011-03-30: 1000 mL via INTRAVENOUS

## 2011-03-30 MED ORDER — ONDANSETRON HCL 4 MG/2ML IJ SOLN: 4.0000 mg | Freq: Four times a day (QID) | INTRAMUSCULAR | Status: DC | PRN

## 2011-03-30 MED ORDER — PROPOFOL 10 MG/ML IV EMUL
INTRAVENOUS | Status: DC | PRN
  Administered 2011-03-30: 200 mg via INTRAVENOUS

## 2011-03-30 MED ORDER — CEFAZOLIN SODIUM-DEXTROSE 2-3 GM-% IV SOLR
2.0000 g | Freq: Once | INTRAVENOUS | Status: AC
  Administered 2011-03-30: 2 g via INTRAVENOUS

## 2011-03-30 MED ORDER — OXYCODONE-ACETAMINOPHEN 5-325 MG/5ML PO SOLN: 5.0000 mL | ORAL | Status: AC | PRN

## 2011-03-30 MED ORDER — LACTATED RINGERS IV SOLN: INTRAVENOUS | Status: DC

## 2011-03-30 SURGICAL SUPPLY — 31 items
BENZOIN TINCTURE PRP APPL 2/3 (GAUZE/BANDAGES/DRESSINGS) IMPLANT
BLADE HEX COATED 2.75 (ELECTRODE) ×2 IMPLANT
CANISTER SUCTION 2500CC (MISCELLANEOUS) ×2 IMPLANT
CLOTH BEACON ORANGE TIMEOUT ST (SAFETY) ×2 IMPLANT
DECANTER SPIKE VIAL GLASS SM (MISCELLANEOUS) ×2 IMPLANT
DERMABOND ADVANCED (GAUZE/BANDAGES/DRESSINGS)
DERMABOND ADVANCED .7 DNX12 (GAUZE/BANDAGES/DRESSINGS) IMPLANT
DRAPE LAPAROSCOPIC ABDOMINAL (DRAPES) ×2 IMPLANT
ELECT REM PT RETURN 9FT ADLT (ELECTROSURGICAL) ×2
ELECTRODE REM PT RTRN 9FT ADLT (ELECTROSURGICAL) ×1 IMPLANT
GAUZE SPONGE 4X4 12PLY STRL LF (GAUZE/BANDAGES/DRESSINGS) ×2 IMPLANT
GLOVE BIOGEL M 8.0 STRL (GLOVE) ×2 IMPLANT
GOWN STRL NON-REIN LRG LVL3 (GOWN DISPOSABLE) ×2 IMPLANT
GOWN STRL REIN XL XLG (GOWN DISPOSABLE) ×4 IMPLANT
KIT ACCESS PORT VG (Band) ×2 IMPLANT
KIT BASIN OR (CUSTOM PROCEDURE TRAY) ×2 IMPLANT
MESH HERNIA 1X4 RECT BARD (Mesh General) ×1 IMPLANT
MESH HERNIA BARD 1X4 (Mesh General) ×1 IMPLANT
NEEDLE HYPO 22GX1.5 SAFETY (NEEDLE) ×2 IMPLANT
NS IRRIG 1000ML POUR BTL (IV SOLUTION) ×2 IMPLANT
PACK GENERAL/GYN (CUSTOM PROCEDURE TRAY) ×2 IMPLANT
STAPLER VISISTAT 35W (STAPLE) IMPLANT
STRIP CLOSURE SKIN 1/2X4 (GAUZE/BANDAGES/DRESSINGS) ×2 IMPLANT
SUT PROLENE 2 0 CT2 30 (SUTURE) ×8 IMPLANT
SUT VIC AB 2-0 SH 27 (SUTURE)
SUT VIC AB 2-0 SH 27X BRD (SUTURE) IMPLANT
SUT VIC AB 4-0 SH 18 (SUTURE) ×2 IMPLANT
SYR 30ML LL (SYRINGE) ×2 IMPLANT
SYR BULB IRRIGATION 50ML (SYRINGE) IMPLANT
SYRINGE 10CC LL (SYRINGE) ×2 IMPLANT
TAPE CLOTH SURG 4X10 WHT LF (GAUZE/BANDAGES/DRESSINGS) ×2 IMPLANT

## 2011-03-30 NOTE — Progress Notes (Signed)
Orders provided for pt via Dr. Daphine Deutscher. He will be by to see pt as soon as he finishes current case. Pt is resting, has ambulated in hallway, denies pain. Will restart on bariatric diet. No complications at this time.

## 2011-03-30 NOTE — Anesthesia Procedure Notes (Addendum)
Procedure Name: LMA Insertion Date/Time: 03/30/2011 12:08 PM Performed by: [place], Tondra Reierson C Pre-anesthesia Checklist: Patient identified, Emergency Drugs available, Suction available and Patient being monitored Patient Re-evaluated:Patient Re-evaluated prior to inductionOxygen Delivery Method: Circle System Utilized Preoxygenation: Pre-oxygenation with 100% oxygen Intubation Type: IV induction LMA: LMA inserted LMA Size: 4.0 Placement Confirmation: positive ETCO2,  CO2 detector and breath sounds checked- equal and bilateral

## 2011-03-30 NOTE — Anesthesia Postprocedure Evaluation (Signed)
  Anesthesia Post-op Note  Patient: Nicole Bridges  Procedure(s) Performed:  GASTRIC BANDING PORT REVISION - Replacement of Lap Band Port  Patient Location: PACU  Anesthesia Type: General  Level of Consciousness: awake and alert   Airway and Oxygen Therapy: Patient Spontanous Breathing  Post-op Pain: mild  Post-op Assessment: Post-op Vital signs reviewed, Patient's Cardiovascular Status Stable, Respiratory Function Stable, Patent Airway and No signs of Nausea or vomiting  Post-op Vital Signs: stable  Complications: No apparent anesthesia complications

## 2011-03-30 NOTE — Op Note (Signed)
Surgeon: Wenda Low, MD, FACS  Asst:  none  Anes:  General by LMA  Procedure: Replacement of lapband port  Diagnosis: Malrotated lapband port   Complications: none  EBL:   1 cc  Description of Procedure:  The patient was taken to OR #11 at [facility] and given general anesthesia.  Prepped with PCMX and draped. Time out was performed.  Old right sided scar above the port was excised.  The port was reached and had rotated inward and there were multiple scratches where access had been attempted.  The port and mesh backing were removed and the old port was disconnected by severing it at the joint with the main line.  A new port was attached and marlex mesh was placed on the back.  It was inserted at the lateral margin of the transverse incision.  This was in a new subcut pocket.  Exparel was injected and the wound was closed in layers with 4-0 vicryl and steristrips.  She was taken to the recovery room in stable condition.  After line was disconnected and new port reattached, I flushed with needle in port and left a net 1.5 cc from the new baseline  Matt B. Daphine Deutscher, MD, [facility], [place] [phone]

## 2011-03-30 NOTE — OR Nursing (Signed)
4 oz coca cola given per protocol of BS= 50 Will recheck BS

## 2011-03-30 NOTE — Transfer of Care (Signed)
Immediate Anesthesia Transfer of Care Note  Patient: Nicole Bridges  Procedure(s) Performed:  GASTRIC BANDING PORT REVISION - Replacement of Lap Band Port  Patient Location: PACU  Anesthesia Type: General  Level of Consciousness: awake and alert   Airway & Oxygen Therapy: Patient Spontanous Breathing and Patient connected to face mask oxygen  Post-op Assessment: Report given to PACU RN, Post -op Vital signs reviewed and stable and Patient moving all extremities X 4  Post vital signs: Reviewed and stable  Complications: No apparent anesthesia complications

## 2011-03-30 NOTE — Anesthesia Preprocedure Evaluation (Addendum)
Anesthesia Evaluation  Patient identified by MRN, date of birth, ID band Patient awake    Reviewed: Allergy & Precautions, H&P , NPO status , Patient's Chart, lab work & pertinent test results  History of Anesthesia Complications (+) PONV  Airway Mallampati: II TM Distance: >3 FB Neck ROM: full    Dental  (+) Edentulous Upper and Dental Advisory Given   Pulmonary neg pulmonary ROS, shortness of breath and with exertion, sleep apnea and Continuous Positive Airway Pressure Ventilation , COPD clear to auscultation  Pulmonary exam normal       Cardiovascular Exercise Tolerance: Good hypertension, On Medications neg cardio ROS regular Normal    Neuro/Psych PSYCHIATRIC DISORDERS Negative Neurological ROS  Negative Psych ROS   GI/Hepatic negative GI ROS, Neg liver ROS,   Endo/Other  Diabetes mellitus-, Well Controlled, Type 2, Oral Hypoglycemic Agents and Insulin DependentHypothyroidism   Renal/GU negative Renal ROS  Genitourinary negative   Musculoskeletal   Abdominal   Peds  Hematology negative hematology ROS (+)   Anesthesia Other Findings   Reproductive/Obstetrics negative OB ROS                         Anesthesia Physical Anesthesia Plan  ASA: III  Anesthesia Plan: General   Post-op Pain Management:    Induction: Intravenous  Airway Management Planned: LMA  Additional Equipment:   Intra-op Plan:   Post-operative Plan:   Informed Consent: I have reviewed the patients History and Physical, chart, labs and discussed the procedure including the risks, benefits and alternatives for the proposed anesthesia with the patient or authorized representative who has indicated his/her understanding and acceptance.   Dental Advisory Given  Plan Discussed with: CRNA and Surgeon  Anesthesia Plan Comments:        Anesthesia Quick Evaluation

## 2011-03-30 NOTE — Progress Notes (Signed)
Notified Daphine Deutscher concerning that there are no orders currently, need orders for patient, spoke at [phone] 03/30/11 Means, [employer]

## 2011-03-30 NOTE — Progress Notes (Signed)
Awaiting a call or visit from Dr. Daphine Deutscher regarding orders for pt. She currently has no ongoing orders! Paged via oncall answering service [phone], operator stated will have him paged.

## 2011-03-31 LAB — GLUCOSE, CAPILLARY: Glucose-Capillary: 187 mg/dL — ABNORMAL HIGH (ref 70–99)

## 2011-03-31 NOTE — Discharge Summary (Signed)
Physician Discharge Summary  Patient ID: Nicole Bridges MRN: [id number] DOB/AGE: 1956/01/08 55 y.o.  Admit date: 03/30/2011 Discharge date: 03/31/2011  Admission Diagnoses:  Discharge Diagnoses:  Active Problems:  * No active hospital problems. *    Discharged Condition: good  Hospital Course: Patient had lapband port revised  Consults: none  Significant Diagnostic Studies: none  Treatments: see above  Discharge Exam: Blood pressure 120/75, pulse 79, temperature 97.8 F (36.6 C), temperature source Oral, resp. rate 20, height 5\' 9"  (1.753 m), weight 290 lb 12.6 oz (131.9 kg), SpO2 96.00%. ready for discharge.  PE not repeated for outpatient  Disposition:   Discharge Orders    Future Orders Please Complete By Expires   Discharge patient        Current Discharge Medication List    START taking these medications   Details  oxyCODONE-acetaminophen (ROXICET) 5-325 MG/5ML solution Take 5 mLs by mouth every 4 (four) hours as needed for pain. Qty: 200 mL, Refills: 0      CONTINUE these medications which have NOT CHANGED   Details  albuterol-ipratropium (COMBIVENT) 18-103 MCG/ACT inhaler Inhale 2 puffs into the lungs every 6 (six) hours as needed.      aspirin 81 MG tablet Take 81 mg by mouth daily.     buPROPion (WELLBUTRIN SR) 150 MG 12 hr tablet Take 150 mg by mouth 2 (two) times daily.     cyclobenzaprine (FLEXERIL) 10 MG tablet Take 10 mg by mouth at bedtime.     enalapril (VASOTEC) 10 MG tablet Take 10 mg by mouth every morning.     exenatide (BYETTA) 10 MCG/0.04ML SOLN Inject 10 mcg into the skin 2 (two) times daily with a meal.     gabapentin (NEURONTIN) 300 MG capsule Take 300 mg by mouth 3 (three) times daily.     glipiZIDE (GLUCOTROL) 10 MG tablet Take 10 mg by mouth daily.     !! hydrochlorothiazide (HYDRODIURIL) 25 MG tablet Take 25 mg by mouth every morning.     Insulin Glargine (LANTUS [place]) Inject 100 Units into the skin at bedtime.       levothyroxine (SYNTHROID, LEVOTHROID) 50 MCG tablet Take 50 mcg by mouth every morning.     LEXAPRO 20 MG tablet Take 20 mg by mouth every morning.     metFORMIN (GLUCOPHAGE) 1000 MG tablet Take 1,000 mg by mouth 2 (two) times daily with a meal.     OVER THE COUNTER MEDICATION Place 1 application into the left eye daily. Pt flushes left eye daily with over the counter mineral oil and saline    simvastatin (ZOCOR) 20 MG tablet Take 20 mg by mouth at bedtime.     traZODone (DESYREL) 50 MG tablet Take 50 mg by mouth at bedtime.     !! HYDROCHLOROTHIAZIDE PO Take by mouth.      !! - Potential duplicate medications found. Please discuss with provider.     Follow-up Information    Follow up with Kacy Hegna B, MD in 2 weeks.   Contact information:   [employer] [address] [place] [address] [phone]          Signed: Valarie Merino 03/31/2011, 8:51 AM

## 2011-03-31 NOTE — Progress Notes (Signed)
Patient d/c'd home with family. Stable at discharge. No concerns voiced.

## 2011-04-01 ENCOUNTER — Encounter (HOSPITAL_COMMUNITY): Payer: Self-pay | Admitting: Surgery

## 2011-04-04 ENCOUNTER — Telehealth (INDEPENDENT_AMBULATORY_CARE_PROVIDER_SITE_OTHER): Payer: Self-pay | Admitting: Surgery

## 2011-04-15 ENCOUNTER — Ambulatory Visit (INDEPENDENT_AMBULATORY_CARE_PROVIDER_SITE_OTHER): Payer: Medicare Other | Admitting: Surgery

## 2011-04-15 DIAGNOSIS — Z9884 Bariatric surgery status: Secondary | ICD-10-CM | POA: Insufficient documentation

## 2011-04-15 NOTE — Patient Instructions (Signed)

## 2011-04-15 NOTE — Progress Notes (Signed)
Nicole Bridges There is no height or weight on file to calculate BMI.  Having regurgitation:  no  Nocturnal reflux?  no  Amount of fill  1

## 2011-06-10 ENCOUNTER — Encounter (INDEPENDENT_AMBULATORY_CARE_PROVIDER_SITE_OTHER): Payer: Medicare Other | Admitting: Surgery

## 2011-07-13 ENCOUNTER — Encounter (INDEPENDENT_AMBULATORY_CARE_PROVIDER_SITE_OTHER): Payer: Self-pay | Admitting: Surgery

## 2011-07-14 ENCOUNTER — Encounter (INDEPENDENT_AMBULATORY_CARE_PROVIDER_SITE_OTHER): Payer: Medicare Other

## 2011-07-21 ENCOUNTER — Encounter (INDEPENDENT_AMBULATORY_CARE_PROVIDER_SITE_OTHER): Payer: Self-pay | Admitting: Surgery

## 2011-07-21 ENCOUNTER — Ambulatory Visit (INDEPENDENT_AMBULATORY_CARE_PROVIDER_SITE_OTHER): Payer: Medicare Other | Admitting: Surgery

## 2011-07-21 ENCOUNTER — Encounter (INDEPENDENT_AMBULATORY_CARE_PROVIDER_SITE_OTHER): Payer: Medicare Other

## 2011-07-21 VITALS — BP 130/82 | HR 94 | Temp 97.4°F | Ht 69.0 in | Wt 183.2 lb

## 2011-07-21 DIAGNOSIS — H5440 Blindness, one eye, unspecified eye: Secondary | ICD-10-CM | POA: Insufficient documentation

## 2011-07-21 DIAGNOSIS — Z9884 Bariatric surgery status: Secondary | ICD-10-CM

## 2011-07-21 NOTE — Progress Notes (Signed)
Nicole Bridges 56 y.o.  Body mass index is 27.05 kg/(m^2).  Patient Active Problem List  Diagnoses  . History of laparoscopic adjustable gastric banding    No Known Allergies  Past Surgical History  Procedure Date  . Laparoscopic gastric banding   . Eye surgery     LEFT EYE REMOVED  . Tubal ligation   . Knee surgery     LEFT   . Gastric banding port revision 03/30/2011    Procedure: GASTRIC BANDING PORT REVISION;  Surgeon: Valarie Merino, MD;  Location: [facility];  Service: General;  Laterality: N/A;  Replacement of Lap Band Port   HAMA Darla Lesches., MD, MD No diagnosis found.  Worked in today.  Hasn't lost any weight since December.  0.5 cc added to band.  Will see back in 3 months Matt B. Daphine Deutscher, MD, [facility] [phone] beeper [phone]  07/21/2011 10:59 AM

## 2011-10-20 ENCOUNTER — Encounter (INDEPENDENT_AMBULATORY_CARE_PROVIDER_SITE_OTHER): Payer: Medicare Other

## 2011-11-24 ENCOUNTER — Encounter (INDEPENDENT_AMBULATORY_CARE_PROVIDER_SITE_OTHER): Payer: Medicare Other

## 2011-11-28 ENCOUNTER — Encounter (INDEPENDENT_AMBULATORY_CARE_PROVIDER_SITE_OTHER): Payer: Self-pay | Admitting: Physician Assistant

## 2015-10-29 ENCOUNTER — Other Ambulatory Visit: Payer: Self-pay | Admitting: Surgery

## 2015-10-29 DIAGNOSIS — K9509 Other complications of gastric band procedure: Secondary | ICD-10-CM

## 2015-11-03 ENCOUNTER — Other Ambulatory Visit: Payer: Medicare Other

## 2015-11-06 ENCOUNTER — Other Ambulatory Visit: Payer: Medicare Other

## 2015-11-09 ENCOUNTER — Other Ambulatory Visit: Payer: Medicare Other

## 2015-11-16 ENCOUNTER — Other Ambulatory Visit: Payer: Self-pay | Admitting: Surgery

## 2015-11-16 ENCOUNTER — Ambulatory Visit
Admission: RE | Admit: 2015-11-16 | Discharge: 2015-11-16 | Disposition: A | Discharge status: Home / Self Care | Payer: Medicare Other | Source: Ambulatory Visit | Attending: Surgery | Admitting: Surgery

## 2015-11-16 DIAGNOSIS — K9509 Other complications of gastric band procedure: Secondary | ICD-10-CM

## 2016-01-27 ENCOUNTER — Encounter (HOSPITAL_COMMUNITY): Payer: Self-pay

## 2017-08-21 ENCOUNTER — Encounter (HOSPITAL_COMMUNITY): Payer: Self-pay

## 2019-04-18 ENCOUNTER — Emergency Department (HOSPITAL_COMMUNITY): Payer: Medicare Other

## 2019-04-18 ENCOUNTER — Inpatient Hospital Stay (HOSPITAL_COMMUNITY)
Admission: EM | Admit: 2019-04-18 | Discharge: 2019-04-21 | DRG: 871 | Disposition: A | Discharge status: Home / Self Care | Payer: Medicare Other | Attending: Internal Medicine | Admitting: Internal Medicine

## 2019-04-18 ENCOUNTER — Encounter (HOSPITAL_COMMUNITY): Payer: Self-pay | Admitting: Emergency Medicine

## 2019-04-18 DIAGNOSIS — Z7951 Long term (current) use of inhaled steroids: Secondary | ICD-10-CM

## 2019-04-18 DIAGNOSIS — I119 Hypertensive heart disease without heart failure: Secondary | ICD-10-CM | POA: Diagnosis present

## 2019-04-18 DIAGNOSIS — G9341 Metabolic encephalopathy: Secondary | ICD-10-CM | POA: Diagnosis present

## 2019-04-18 DIAGNOSIS — E039 Hypothyroidism, unspecified: Secondary | ICD-10-CM | POA: Diagnosis present

## 2019-04-18 DIAGNOSIS — J9600 Acute respiratory failure, unspecified whether with hypoxia or hypercapnia: Secondary | ICD-10-CM | POA: Diagnosis present

## 2019-04-18 DIAGNOSIS — Z6841 Body Mass Index (BMI) 40.0 and over, adult: Secondary | ICD-10-CM

## 2019-04-18 DIAGNOSIS — R0602 Shortness of breath: Secondary | ICD-10-CM | POA: Diagnosis not present

## 2019-04-18 DIAGNOSIS — R6521 Severe sepsis with septic shock: Secondary | ICD-10-CM | POA: Diagnosis present

## 2019-04-18 DIAGNOSIS — G9340 Encephalopathy, unspecified: Secondary | ICD-10-CM | POA: Diagnosis not present

## 2019-04-18 DIAGNOSIS — I361 Nonrheumatic tricuspid (valve) insufficiency: Secondary | ICD-10-CM | POA: Diagnosis not present

## 2019-04-18 DIAGNOSIS — E119 Type 2 diabetes mellitus without complications: Secondary | ICD-10-CM | POA: Diagnosis present

## 2019-04-18 DIAGNOSIS — N179 Acute kidney failure, unspecified: Secondary | ICD-10-CM | POA: Diagnosis present

## 2019-04-18 DIAGNOSIS — Z794 Long term (current) use of insulin: Secondary | ICD-10-CM

## 2019-04-18 DIAGNOSIS — Z79890 Hormone replacement therapy (postmenopausal): Secondary | ICD-10-CM

## 2019-04-18 DIAGNOSIS — E785 Hyperlipidemia, unspecified: Secondary | ICD-10-CM | POA: Diagnosis present

## 2019-04-18 DIAGNOSIS — E8770 Fluid overload, unspecified: Secondary | ICD-10-CM | POA: Diagnosis present

## 2019-04-18 DIAGNOSIS — E876 Hypokalemia: Secondary | ICD-10-CM | POA: Diagnosis present

## 2019-04-18 DIAGNOSIS — B9620 Unspecified Escherichia coli [E. coli] as the cause of diseases classified elsewhere: Secondary | ICD-10-CM | POA: Diagnosis not present

## 2019-04-18 DIAGNOSIS — N390 Urinary tract infection, site not specified: Secondary | ICD-10-CM

## 2019-04-18 DIAGNOSIS — A4151 Sepsis due to Escherichia coli [E. coli]: Principal | ICD-10-CM | POA: Diagnosis present

## 2019-04-18 DIAGNOSIS — Z87440 Personal history of urinary (tract) infections: Secondary | ICD-10-CM

## 2019-04-18 DIAGNOSIS — J810 Acute pulmonary edema: Secondary | ICD-10-CM | POA: Diagnosis present

## 2019-04-18 DIAGNOSIS — N12 Tubulo-interstitial nephritis, not specified as acute or chronic: Secondary | ICD-10-CM | POA: Diagnosis present

## 2019-04-18 DIAGNOSIS — Z87891 Personal history of nicotine dependence: Secondary | ICD-10-CM

## 2019-04-18 DIAGNOSIS — Z79899 Other long term (current) drug therapy: Secondary | ICD-10-CM | POA: Diagnosis not present

## 2019-04-18 DIAGNOSIS — J449 Chronic obstructive pulmonary disease, unspecified: Secondary | ICD-10-CM | POA: Diagnosis present

## 2019-04-18 DIAGNOSIS — F329 Major depressive disorder, single episode, unspecified: Secondary | ICD-10-CM | POA: Diagnosis present

## 2019-04-18 DIAGNOSIS — Z20828 Contact with and (suspected) exposure to other viral communicable diseases: Secondary | ICD-10-CM | POA: Diagnosis present

## 2019-04-18 DIAGNOSIS — G4733 Obstructive sleep apnea (adult) (pediatric): Secondary | ICD-10-CM | POA: Diagnosis present

## 2019-04-18 DIAGNOSIS — Z9884 Bariatric surgery status: Secondary | ICD-10-CM

## 2019-04-18 DIAGNOSIS — I10 Essential (primary) hypertension: Secondary | ICD-10-CM | POA: Diagnosis not present

## 2019-04-18 DIAGNOSIS — Z7982 Long term (current) use of aspirin: Secondary | ICD-10-CM | POA: Diagnosis not present

## 2019-04-18 DIAGNOSIS — J811 Chronic pulmonary edema: Secondary | ICD-10-CM | POA: Diagnosis not present

## 2019-04-18 DIAGNOSIS — Z9001 Acquired absence of eye: Secondary | ICD-10-CM

## 2019-04-18 DIAGNOSIS — E669 Obesity, unspecified: Secondary | ICD-10-CM | POA: Diagnosis present

## 2019-04-18 DIAGNOSIS — R7881 Bacteremia: Secondary | ICD-10-CM

## 2019-04-18 DIAGNOSIS — A419 Sepsis, unspecified organism: Secondary | ICD-10-CM | POA: Diagnosis present

## 2019-04-18 LAB — BLOOD CULTURE ID PANEL (REFLEXED)

## 2019-04-18 LAB — PROTIME-INR
INR: 1.1 (ref 0.8–1.2)
Prothrombin Time: 13.9 seconds (ref 11.4–15.2)

## 2019-04-18 LAB — CBC WITH DIFFERENTIAL/PLATELET
Abs Immature Granulocytes: 0.04 10*3/uL (ref 0.00–0.07)
Basophils Absolute: 0 10*3/uL (ref 0.0–0.1)
Basophils Relative: 0 %
Eosinophils Absolute: 0 10*3/uL (ref 0.0–0.5)
Eosinophils Relative: 0 %
HCT: 37.7 % (ref 36.0–46.0)
Hemoglobin: 11.9 g/dL — ABNORMAL LOW (ref 12.0–15.0)
Immature Granulocytes: 1 %
Lymphocytes Relative: 6 %
Lymphs Abs: 0.4 10*3/uL — ABNORMAL LOW (ref 0.7–4.0)
MCH: 29 pg (ref 26.0–34.0)
MCHC: 31.6 g/dL (ref 30.0–36.0)
MCV: 92 fL (ref 80.0–100.0)
Monocytes Absolute: 0.4 10*3/uL (ref 0.1–1.0)
Monocytes Relative: 5 %
Neutro Abs: 6.5 10*3/uL (ref 1.7–7.7)
Neutrophils Relative %: 88 %
Platelets: 162 10*3/uL (ref 150–400)
RBC: 4.1 MIL/uL (ref 3.87–5.11)
RDW: 13.6 % (ref 11.5–15.5)
WBC: 7.4 10*3/uL (ref 4.0–10.5)
nRBC: 0 % (ref 0.0–0.2)

## 2019-04-18 LAB — URINALYSIS, ROUTINE W REFLEX MICROSCOPIC
Bilirubin Urine: NEGATIVE
Glucose, UA: >=500 mg/dL — AB
Ketones, ur: 20 mg/dL — AB
Nitrite: POSITIVE — AB
Protein, ur: 30 mg/dL — AB
Specific Gravity, Urine: 1.009 (ref 1.005–1.030)
pH: 6 (ref 5.0–8.0)

## 2019-04-18 LAB — COMPREHENSIVE METABOLIC PANEL
ALT: 21 U/L (ref 0–44)
AST: 21 U/L (ref 15–41)
Albumin: 3.5 g/dL (ref 3.5–5.0)
Alkaline Phosphatase: 99 U/L (ref 38–126)
Anion gap: 14 (ref 5–15)
BUN: 22 mg/dL (ref 8–23)
CO2: 20 mmol/L — ABNORMAL LOW (ref 22–32)
Calcium: 8.9 mg/dL (ref 8.9–10.3)
Chloride: 99 mmol/L (ref 98–111)
Creatinine, Ser: 1.4 mg/dL — ABNORMAL HIGH (ref 0.44–1.00)
GFR calc Af Amer: 46 mL/min — ABNORMAL LOW (ref >60)
GFR calc non Af Amer: 40 mL/min — ABNORMAL LOW (ref >60)
Glucose, Bld: 272 mg/dL — ABNORMAL HIGH (ref 70–99)
Potassium: 4.2 mmol/L (ref 3.5–5.1)
Sodium: 133 mmol/L — ABNORMAL LOW (ref 135–145)
Total Bilirubin: 0.9 mg/dL (ref 0.3–1.2)
Total Protein: 7.9 g/dL (ref 6.5–8.1)

## 2019-04-18 LAB — LACTIC ACID, PLASMA
Lactic Acid, Venous: 2.1 mmol/L (ref 0.5–1.9)
Lactic Acid, Venous: 1.6 mmol/L (ref 0.5–1.9)
Lactic Acid, Venous: 1.5 mmol/L (ref 0.5–1.9)

## 2019-04-18 LAB — HEMOGLOBIN A1C
Hgb A1c MFr Bld: 8.7 % — ABNORMAL HIGH (ref 4.8–5.6)
Mean Plasma Glucose: 202.99 mg/dL

## 2019-04-18 LAB — CBG MONITORING, ED
Glucose-Capillary: 250 mg/dL — ABNORMAL HIGH (ref 70–99)
Glucose-Capillary: 197 mg/dL — ABNORMAL HIGH (ref 70–99)

## 2019-04-18 LAB — APTT: aPTT: 35 seconds (ref 24–36)

## 2019-04-18 LAB — POC SARS CORONAVIRUS 2 AG -  ED: SARS Coronavirus 2 Ag: NEGATIVE

## 2019-04-18 LAB — SARS CORONAVIRUS 2 (TAT 6-24 HRS): SARS Coronavirus 2: NEGATIVE

## 2019-04-18 LAB — HIV ANTIBODY (ROUTINE TESTING W REFLEX): HIV Screen 4th Generation wRfx: NONREACTIVE

## 2019-04-18 MED ORDER — SODIUM CHLORIDE 0.9% FLUSH
3.0000 mL | Freq: Two times a day (BID) | INTRAVENOUS | Status: DC
  Administered 2019-04-19 – 2019-04-21 (×5): 3 mL via INTRAVENOUS

## 2019-04-18 MED ORDER — LACTATED RINGERS IV BOLUS
500.0000 mL | Freq: Once | INTRAVENOUS | Status: AC
  Administered 2019-04-18: 500 mL via INTRAVENOUS

## 2019-04-18 MED ORDER — ONDANSETRON HCL 4 MG/2ML IJ SOLN: 4.0000 mg | Freq: Four times a day (QID) | INTRAMUSCULAR | Status: DC | PRN

## 2019-04-18 MED ORDER — ACETAMINOPHEN 650 MG RE SUPP
975.0000 mg | Freq: Once | RECTAL | Status: AC
  Administered 2019-04-18: 975 mg via RECTAL
  Filled 2019-04-18: qty 1

## 2019-04-18 MED ORDER — LACTATED RINGERS IV BOLUS (SEPSIS)
1000.0000 mL | Freq: Once | INTRAVENOUS | Status: AC
  Administered 2019-04-18: 1000 mL via INTRAVENOUS

## 2019-04-18 MED ORDER — VANCOMYCIN HCL 1250 MG/250ML IV SOLN
1250.0000 mg | INTRAVENOUS | Status: DC
  Filled 2019-04-18: qty 250

## 2019-04-18 MED ORDER — ACETAMINOPHEN 325 MG PO TABS
650.0000 mg | ORAL_TABLET | Freq: Four times a day (QID) | ORAL | Status: DC | PRN
  Administered 2019-04-18 – 2019-04-19 (×3): 650 mg via ORAL
  Filled 2019-04-18 (×3): qty 2

## 2019-04-18 MED ORDER — VANCOMYCIN HCL 2000 MG/400ML IV SOLN
2000.0000 mg | Freq: Once | INTRAVENOUS | Status: AC
  Administered 2019-04-18: 2000 mg via INTRAVENOUS
  Filled 2019-04-18: qty 400

## 2019-04-18 MED ORDER — METRONIDAZOLE IN NACL 5-0.79 MG/ML-% IV SOLN
500.0000 mg | Freq: Once | INTRAVENOUS | Status: AC
  Administered 2019-04-18: 500 mg via INTRAVENOUS
  Filled 2019-04-18: qty 100

## 2019-04-18 MED ORDER — LACTATED RINGERS IV BOLUS (SEPSIS)
1000.0000 mL | Freq: Once | INTRAVENOUS | Status: AC
  Administered 2019-04-18: 12:00:00 1000 mL via INTRAVENOUS

## 2019-04-18 MED ORDER — POLYETHYLENE GLYCOL 3350 17 G PO PACK: 17.0000 g | PACK | Freq: Every day | ORAL | Status: DC | PRN

## 2019-04-18 MED ORDER — SODIUM CHLORIDE 0.9 % IV SOLN
2.0000 g | INTRAVENOUS | Status: DC
  Administered 2019-04-19 – 2019-04-21 (×3): 2 g via INTRAVENOUS
  Filled 2019-04-18: qty 20
  Filled 2019-04-18 (×2): qty 2

## 2019-04-18 MED ORDER — CEFEPIME HCL 2 G IJ SOLR
2.0000 g | Freq: Once | INTRAMUSCULAR | Status: AC
  Administered 2019-04-18: 12:00:00 2 g via INTRAVENOUS
  Filled 2019-04-18: qty 2

## 2019-04-18 MED ORDER — INSULIN ASPART 100 UNIT/ML ~~LOC~~ SOLN
0.0000 [IU] | Freq: Three times a day (TID) | SUBCUTANEOUS | Status: DC
  Administered 2019-04-18 – 2019-04-19 (×2): 2 [IU] via SUBCUTANEOUS

## 2019-04-18 MED ORDER — LACTATED RINGERS IV SOLN: INTRAVENOUS | Status: DC

## 2019-04-18 MED ORDER — VANCOMYCIN HCL IN DEXTROSE 1-5 GM/200ML-% IV SOLN: 1000.0000 mg | Freq: Once | INTRAVENOUS | Status: DC

## 2019-04-18 MED ORDER — ENOXAPARIN SODIUM 60 MG/0.6ML ~~LOC~~ SOLN
60.0000 mg | SUBCUTANEOUS | Status: DC
  Administered 2019-04-18 – 2019-04-20 (×3): 60 mg via SUBCUTANEOUS
  Filled 2019-04-18 (×3): qty 0.6

## 2019-04-18 MED ORDER — IBUPROFEN 400 MG PO TABS: 600.0000 mg | ORAL_TABLET | Freq: Once | ORAL | Status: DC

## 2019-04-18 MED ORDER — ONDANSETRON HCL 4 MG PO TABS: 4.0000 mg | ORAL_TABLET | Freq: Four times a day (QID) | ORAL | Status: DC | PRN

## 2019-04-18 MED ORDER — ACETAMINOPHEN 650 MG RE SUPP: 650.0000 mg | Freq: Four times a day (QID) | RECTAL | Status: DC | PRN

## 2019-04-18 MED ORDER — SODIUM CHLORIDE 0.9 % IV SOLN
2.0000 g | Freq: Three times a day (TID) | INTRAVENOUS | Status: DC
  Administered 2019-04-18: 2 g via INTRAVENOUS
  Filled 2019-04-18: qty 2

## 2019-04-18 MED ORDER — SODIUM CHLORIDE 0.9 % IV SOLN: 2.0000 g | INTRAVENOUS | Status: DC

## 2019-04-18 NOTE — ED Notes (Signed)
Pt. Complains of left lateral abdominal pain that starts on the right and radiates to the left; reports mild discomfort

## 2019-04-18 NOTE — ED Notes (Signed)
Internal medicine at bedside

## 2019-04-18 NOTE — Progress Notes (Signed)
PHARMACY - PHYSICIAN COMMUNICATION CRITICAL VALUE ALERT - BLOOD CULTURE IDENTIFICATION (BCID)  [name] is an 63 y.o. female who presented to [facility] on 04/18/2019 with a chief complaint of flank pain  Assessment:  4/4 BC with E coli, urinary source  Name of physician (or Provider) Contacted: Dr Fritzi Mandes  Current antibiotics: cefepime  Changes to prescribed antibiotics recommended:  Rocephin 2gm IV q24  Results for orders placed or performed during the hospital encounter of 04/18/19  Blood Culture ID Panel (Reflexed) (Collected: 04/18/2019 11:18 AM)  Result Value Ref Range   Enterococcus species NOT DETECTED NOT DETECTED   Listeria monocytogenes NOT DETECTED NOT DETECTED   Staphylococcus species NOT DETECTED NOT DETECTED   Staphylococcus aureus (BCID) NOT DETECTED NOT DETECTED   Streptococcus species NOT DETECTED NOT DETECTED   Streptococcus agalactiae NOT DETECTED NOT DETECTED   Streptococcus pneumoniae NOT DETECTED NOT DETECTED   Streptococcus pyogenes NOT DETECTED NOT DETECTED   Acinetobacter baumannii NOT DETECTED NOT DETECTED   Enterobacteriaceae species DETECTED (A) NOT DETECTED   Enterobacter cloacae complex NOT DETECTED NOT DETECTED   Escherichia coli DETECTED (A) NOT DETECTED   Klebsiella oxytoca NOT DETECTED NOT DETECTED   Klebsiella pneumoniae NOT DETECTED NOT DETECTED   Proteus species NOT DETECTED NOT DETECTED   Serratia marcescens NOT DETECTED NOT DETECTED   Carbapenem resistance NOT DETECTED NOT DETECTED   Haemophilus influenzae NOT DETECTED NOT DETECTED   Neisseria meningitidis NOT DETECTED NOT DETECTED   Pseudomonas aeruginosa NOT DETECTED NOT DETECTED   Candida albicans NOT DETECTED NOT DETECTED   Candida glabrata NOT DETECTED NOT DETECTED   Candida krusei NOT DETECTED NOT DETECTED   Candida parapsilosis NOT DETECTED NOT DETECTED   Candida tropicalis NOT DETECTED NOT DETECTED    Beverlee Nims 04/18/2019  11:36 PM

## 2019-04-18 NOTE — H&P (Signed)
NAME:  Nicole Bridges, MRN:  [id number], DOB:  05/11/1955, LOS: 0 ADMISSION DATE:  04/18/2019, Primary: Paschal DoppHama Amin, Sheliah PlaneAli M., MD  CHIEF COMPLAINT:  Altered mental status  Medical Service: Internal Medicine Teaching Service         Attending Physician: Dr. Lorre NickAllen, Anthony, MD    First Contact: Dr. Ephriam Knuckleshristian Pager: [phone]  Second Contact: Dr. Maryla MorrowMasoudi Pager: [phone]       After Hours (After 5p/  First Contact Pager: [phone]  weekends / holidays): Second Contact Pager: [phone]    History of present illness   Nicole Bridges is a 63 yo female who [facility] with one day history of altered mental status. Daughter is primary historian and notes that the patient was in her usual state of health this morning up until this morning when suddenly became very warm, complained of flank pain and developed sudden change in mental status. Daughter is unaware of any recent dysuria however does note that she wears depends underwear and often only toliets once or twice a day. She also endorses several hospitalizations for bladder and kidney infections that had developed into sepsis.  Past Medical History  OAH T2DM HTN/HLD Hypothyroidism OSA Depression Recurrent UTIs and pyelonephritis  Past Surgical History  Gastric lap banding (unknown date), revision (2012) Left eye enucleation (unknown date)  Home Medications     Prior to Admission medications   Medication Sig Start Date End Date Taking? Authorizing Provider  albuterol-ipratropium (COMBIVENT) 18-103 MCG/ACT inhaler Inhale 2 puffs into the lungs every 6 (six) hours as needed.      [provider]  aspirin 81 MG tablet Take 81 mg by mouth daily.     [provider]  buPROPion (WELLBUTRIN SR) 150 MG 12 hr tablet Take 150 mg by mouth 2 (two) times daily.     [provider]  cyclobenzaprine (FLEXERIL) 10 MG tablet Take 10 mg by mouth at bedtime.     [provider]  enalapril  (VASOTEC) 10 MG tablet Take 10 mg by mouth every morning.     [provider]  exenatide (BYETTA) 10 MCG/0.04ML SOLN Inject 10 mcg into the skin 2 (two) times daily with a meal.     [provider]  gabapentin (NEURONTIN) 300 MG capsule Take 300 mg by mouth 3 (three) times daily.     [provider]  glipiZIDE (GLUCOTROL) 10 MG tablet Take 10 mg by mouth daily.     [provider]  hydrochlorothiazide (HYDRODIURIL) 25 MG tablet Take 25 mg by mouth every morning.     [provider]  HYDROCHLOROTHIAZIDE PO Take by mouth.     [provider]  Insulin Glargine (LANTUS [place]) Inject 100 Units into the skin at bedtime.     [provider]  levothyroxine (SYNTHROID, LEVOTHROID) 50 MCG tablet Take 50 mcg by mouth every morning.     [provider]  LEXAPRO 20 MG tablet Take 20 mg by mouth every morning.  01/25/11   [provider]  metFORMIN (GLUCOPHAGE) 1000 MG tablet Take 1,000 mg by mouth 2 (two) times daily with a meal.     [provider]  OVER THE COUNTER MEDICATION Place 1 application into the left eye daily. Pt flushes left eye daily with over the counter mineral oil and saline    [provider]  simvastatin (ZOCOR) 20 MG tablet Take 20 mg by mouth at bedtime.     [provider]  traZODone (DESYREL) 50 MG tablet Take 50 mg by mouth at bedtime.     [provider]    Allergies    Allergies as of 04/18/2019  . (No Known Allergies)    Social History  Resides in [place] with her husband. Formerly smoked tobacco products.   Family History   No pertinent family history.  ROS  Unable to be obtained due to encephalopathy  Objective   Blood pressure 129/67, pulse (!) 122, temperature (!) 103.8 F (39.9 C), temperature source Rectal, resp. rate (!) 38, height 5\' 9"  (1.753 m), weight 136.1 kg, SpO2 96 %.    Filed Weights   04/18/19 1120  Weight: 136.1 kg     Examination: GENERAL: ill appearing HEENT: head atraumatic. Left eye prothesis CARDIAC: tachycardic rate, regular rhythm. No peripheral edema. Extremities warm. PULMONARY: acyanotic. Lung sounds clear to auscultation. ABDOMEN: soft. bs active. CVA tenderness. No abdominal or suprapubic tenderness. NEURO: lethargic but awakens to voice and answers questions.  SKIN: no rash or lesions on limited exam  Musculoskeletal: pain with left knee flexion which is baseline according to daughter.  Significant Diagnostic Tests:  EKG: sinus tachycardic.  CXR: appears to have cardiomegaly with associated vascular congestion  Labs    CBC Latest Ref Rng & Units 04/18/2019 03/30/2011 03/28/2011  WBC 4.0 - 10.5 K/uL 7.4 5.0 5.0  Hemoglobin 12.0 - 15.0 g/dL 11.9(L) 11.6(L) 12.2  Hematocrit 36.0 - 46.0 % 37.7 35.0(L) 36.9  Platelets 150 - 400 K/uL 162 205 233   BMP Latest Ref Rng & Units 04/18/2019 03/30/2011 03/28/2011  Glucose 70 - 99 mg/dL 14/06/2010) - 834(H)  BUN 8 - 23 mg/dL 22 - 13  Creatinine 962(I - 1.00 mg/dL 2.97) 9.89(Q 1.19  Sodium 135 - 145 mmol/L 133(L) - 137  Potassium 3.5 - 5.1 mmol/L 4.2 - 4.1  Chloride 98 - 111 mmol/L 99 - 100  CO2 22 - 32 mmol/L 20(L) - 28  Calcium 8.9 - 10.3 mg/dL 8.9 - 9.7   Lactic 4.17 UA>>+++bacteria, +++leukocytes, +nitrites, >500glucose, +hgb  COVID-19: NEGATIVE Summary  Nicole Bridges is a 63 yo female with PMH of T2DM, hypothyroidism and hypertension who presented to the [facility] ED on 04/18/19 for altered mental status and was subsequently admitted to [facility]MTS for urosepsis management.  Assessment & Plan:  Active Problems:   * No active hospital problems. *  Sepsis. Likely urosepsis. Febrile on admission--104F. UA consistent with UTI. Interestingly, no leukocytosis present.  CXR on admission suggestive of vascular congestion however no distinct infiltrate present. COVID negative. Blood pressures trending down despite 3.5L of IVF however are now  appearing to stabilize. Acute encephalopathy likely 2/2 to sepsis. Acute Kidney Injury. Likely related to urosepsis. Would expect this to improve with fluids and infection resolution. Plan Blood and urine cultures pending Continue vanc and cefepime IVFs Repeat lactate tonight Repeat labs in am Maintain MAPs >60 Tele monitoring  Chronic Hypertension. CXR findings somewhat concerning for a heart failure component. Would consider echo for further evaluation if she begins to seem to be retaining fluid. No recent echo since 2013. Plan: continue to hold home antihypertensives for hypotension.  Chronic T2DM. Blood sugars elevated on admission--likely reactive to infection. A1C pending. Will do sensitive SSI for now and titrate up as needed. Chronic hypothyroidism. On 2014 synthroid at home and will continue this here. Best practice:  CODE STATUS: FULL Diet: diabetic, renal Pain management: tylenol DVT for prophylaxis: lovenox Social considerations/Family communication: spoke with patient's daughter  who is a traveling Therapist, sports. Dispo: Admit patient to Inpatient with expected length of stay greater than 2 midnights.   Mitzi Hansen, MD INTERNAL MEDICINE RESIDENT PGY-1 Pager # [phone] @TODAY @ 1:52 PM

## 2019-04-18 NOTE — Progress Notes (Signed)
Notified bedside nurse of need to draw repeat lactic acid.  Secure chat sent asking for RN to draw repeat lactic acid.

## 2019-04-18 NOTE — ED Triage Notes (Signed)
Pt arrives POV with complaints of altered mental status and possible seizure activity. Pt was riding in the car with daughter when she got very hot and began "flopping" in the car.

## 2019-04-18 NOTE — Progress Notes (Signed)
Pharmacy Antibiotic Note  Nicole Bridges is a 63 y.o. female admitted on 04/18/2019 with sepsis.  Pharmacy has been consulted for cefepime and vancomycin dosing.  Presenting with AMS - previously COVID + in August. WBC 7.4, LA 2.1, temp 103.9. Scr 1.4 (normCrCl 55 mL/min).   Plan: Vancomycin 2g IV once then 1250 mg IV every 24 hours (estAUC 458)  Cefepime 2g IV every 8 hours Monitor renal fx, clinical pic, cx results, and vanc levels as appropriate.     Temp (24hrs), Avg:103.9 F (39.9 C), Min:103.9 F (39.9 C), Max:103.9 F (39.9 C)  No results for input(s): WBC, CREATININE, LATICACIDVEN, VANCOTROUGH, VANCOPEAK, VANCORANDOM, GENTTROUGH, GENTPEAK, GENTRANDOM, TOBRATROUGH, TOBRAPEAK, TOBRARND, AMIKACINPEAK, AMIKACINTROU, AMIKACIN in the last 168 hours.  CrCl cannot be calculated (Patient's most recent lab result is older than the maximum 21 days allowed.).    No Known Allergies  Antimicrobials this admission: Vancomycin 12/24 >>  Cefepime 12/24 >>   Dose adjustments this admission: N/A  Microbiology results: 12/24 BCx: sent 12/24 UCx: sent   Thank you for allowing pharmacy to be a part of this patient's care.  Antonietta Jewel, PharmD, BCCCP Clinical Pharmacist  Phone: [phone]  Please check AMION for all [place] phone numbers After 10:00 PM, call [place] [phone] 04/18/2019 11:38 AM

## 2019-04-18 NOTE — ED Notes (Addendum)
updated pt's daughter Verdis Frederickson) with pt status and plan of care (w/ pt's permission); pt spoke with daughter on phone

## 2019-04-18 NOTE — ED Notes (Signed)
Dr. Leatha Gilding notified of hypotension

## 2019-04-18 NOTE — ED Notes (Signed)
Delaynee Alred- Daughter, [phone]

## 2019-04-18 NOTE — ED Notes (Signed)
Pt repirations run between 25 and 44 they back and forth but she does not appear to be labored. O@ stats good at 94 to 96. Will continue to monitor.Pt. states that she does feel like ti is hard to breathe. Pt repositioned in bed and montiored

## 2019-04-18 NOTE — ED Notes (Signed)
Pt. Is oriented x 3 she knows her, location  And month. When asked the day she didn't know that it was christmas. She is in a good mood despite circumstances

## 2019-04-18 NOTE — ED Notes (Signed)
Pt. contiues to become more alert and oriented. Pt's temp remains at approx 100 other than a complaint of left lateral pain she states she is feeling fine

## 2019-04-18 NOTE — ED Provider Notes (Signed)
[facility] EMERGENCY [facility]PARTMENT Provider Note   CSN: [id number] Arrival date & time: 04/18/19  1054     History Chief Complaint  Patient presents with  . Altered Mental Status    Felecia ShellingCheryl L Catoe is a 63 y.o. female.  63 year old female presents with altered mental status which began today.  According to her daughter, patient had chills yesterday but they resolved.  Today she felt better and while waiting in the car for her became very warm and had altered mental status.  No reported cough, vomiting, emesis.  Patient did have Covid back and August.  No history of trauma.  No treatment used prior to arrival.        Past Medical History:  Diagnosis Date  . Arthritis   . COPD (chronic obstructive pulmonary disease) (HCC)    INHALER JUST IF NEEDED  . Depression   . Diabetes mellitus   . Hyperlipidemia   . Hypertension   . Hypothyroidism   . PONV (postoperative nausea and vomiting)   . Shortness of breath    WITH EXERTION  . Sleep apnea    USES CPAP AT NIGHT    Patient Active Problem List   Diagnosis Date Noted  . Blindness of one eye 07/21/2011  . Lapband APL + HH repair 01/19/10 04/15/2011    Past Surgical History:  Procedure Laterality Date  . EYE SURGERY     LEFT EYE REMOVED  . GASTRIC BANDING PORT REVISION  03/30/2011   Procedure: GASTRIC BANDING PORT REVISION;  Surgeon: Valarie MerinoMatthew B Martin, MD;  Location: [facility];  Service: General;  Laterality: N/A;  Replacement of Lap Band Port  . KNEE SURGERY     LEFT   . LAPAROSCOPIC GASTRIC BANDING    . TUBAL LIGATION       OB History   No obstetric history on file.     No family history on file.  Social History   Tobacco Use  . Smoking status: Former Games developermoker  . Smokeless tobacco: Never Used  Substance Use Topics  . Alcohol use: No  . Drug use: No    Home Medications Prior to Admission medications   Medication Sig Start Date End Date Taking? Authorizing Provider  albuterol-ipratropium  (COMBIVENT) 18-103 MCG/ACT inhaler Inhale 2 puffs into the lungs every 6 (six) hours as needed.      [provider]  aspirin 81 MG tablet Take 81 mg by mouth daily.     [provider]  buPROPion (WELLBUTRIN SR) 150 MG 12 hr tablet Take 150 mg by mouth 2 (two) times daily.     [provider]  cyclobenzaprine (FLEXERIL) 10 MG tablet Take 10 mg by mouth at bedtime.     [provider]  enalapril (VASOTEC) 10 MG tablet Take 10 mg by mouth every morning.     [provider]  exenatide (BYETTA) 10 MCG/0.04ML SOLN Inject 10 mcg into the skin 2 (two) times daily with a meal.     [provider]  gabapentin (NEURONTIN) 300 MG capsule Take 300 mg by mouth 3 (three) times daily.     [provider]  glipiZIDE (GLUCOTROL) 10 MG tablet Take 10 mg by mouth daily.     [provider]  hydrochlorothiazide (HYDRODIURIL) 25 MG tablet Take 25 mg by mouth every morning.     [provider]  HYDROCHLOROTHIAZIDE PO Take by mouth.     [provider]  Insulin Glargine (LANTUS ) Inject 100 Units  into the skin at bedtime.     [provider]  levothyroxine (SYNTHROID, LEVOTHROID) 50 MCG tablet Take 50 mcg by mouth every morning.     [provider]  LEXAPRO 20 MG tablet Take 20 mg by mouth every morning.  01/25/11   [provider]  metFORMIN (GLUCOPHAGE) 1000 MG tablet Take 1,000 mg by mouth 2 (two) times daily with a meal.     [provider]  [place] 1 application into the left eye daily. Pt flushes left eye daily with over the counter mineral oil and saline    [provider]  simvastatin (ZOCOR) 20 MG tablet Take 20 mg by mouth at bedtime.     [provider]  traZODone (DESYREL) 50 MG tablet Take 50 mg by mouth at bedtime.     [provider]    Allergies    Patient has no known allergies.  Review of Systems   Review of Systems    Unable to perform ROS: Acuity of condition    Physical Exam Updated Vital Signs BP (!) 161/75 (BP Location: Left Arm)   Pulse (!) 123   Temp (!) 103.9 F (39.9 C) (Rectal)   Resp (!) 30   SpO2 96%   Physical Exam Vitals and nursing note reviewed.  Constitutional:      General: She is not in acute distress.    Appearance: Normal appearance. She is well-developed. She is not toxic-appearing.  HENT:     Head: Normocephalic and atraumatic.  Eyes:     General: Lids are normal.     Conjunctiva/sclera: Conjunctivae normal.     Pupils: Pupils are equal, round, and reactive to light.  Neck:     Thyroid: No thyroid mass.     Trachea: No tracheal deviation.  Cardiovascular:     Rate and Rhythm: Normal rate and regular rhythm.     Heart sounds: Normal heart sounds. No murmur. No gallop.   Pulmonary:     Effort: Pulmonary effort is normal. No respiratory distress.     Breath sounds: Normal breath sounds. No stridor. No decreased breath sounds, wheezing, rhonchi or rales.  Abdominal:     General: Bowel sounds are normal. There is no distension.     Palpations: Abdomen is soft.     Tenderness: There is no abdominal tenderness. There is no rebound.  Musculoskeletal:        General: No tenderness. Normal range of motion.     Cervical back: Normal range of motion and neck supple.  Skin:    General: Skin is warm and dry.     Findings: No abrasion or rash.  Neurological:     Mental Status: She is lethargic, disoriented and confused.     GCS: GCS eye subscore is 4. GCS verbal subscore is 4. GCS motor subscore is 5.     Cranial Nerves: No cranial nerve deficit.     Sensory: No sensory deficit.     Motor: No tremor.     Comments: Not cooperative with exam  Psychiatric:        Attention and Perception: She is inattentive.     ED Results / Procedures / Treatments   Labs (all labs ordered are listed, but only abnormal results are displayed) Labs Reviewed  CBG MONITORING, ED -  Abnormal; Notable for the following components:      Result Value   Glucose-Capillary 250 (*)    All other components within normal  limits  CULTURE, BLOOD (ROUTINE X 2)  CULTURE, BLOOD (ROUTINE X 2)  URINE CULTURE  LACTIC ACID, PLASMA  LACTIC ACID, PLASMA  COMPREHENSIVE METABOLIC PANEL  CBC WITH DIFFERENTIAL/PLATELET  APTT  PROTIME-INR  URINALYSIS, ROUTINE W REFLEX MICROSCOPIC  POC SARS CORONAVIRUS 2 AG -  ED    EKG EKG Interpretation  Date/Time:  Thursday April 18 2019 11:09:56 EST Ventricular Rate:  136 PR Interval:    QRS Duration: 81 QT Interval:  279 QTC Calculation: 420 R Axis:   32 Text Interpretation: Sinus tachycardia Consider right atrial enlargement Borderline T abnormalities, lateral leads Confirmed by Lorre Nick ([id number]) on 04/18/2019 1:08:05 PM   Radiology No results found.  Procedures Procedures (including critical care time)  Medications Ordered in ED Medications  ceFEPIme (MAXIPIME) 2 g in sodium chloride 0.9 % 100 mL IVPB (has no administration in time range)  metroNIDAZOLE (FLAGYL) IVPB 500 mg (has no administration in time range)  vancomycin (VANCOCIN) IVPB 1000 mg/200 mL premix (has no administration in time range)  lactated ringers bolus 1,000 mL (has no administration in time range)    And  lactated ringers bolus 1,000 mL (has no administration in time range)    And  lactated ringers bolus 1,000 mL (has no administration in time range)  acetaminophen (TYLENOL) suppository 975 mg (975 mg Rectal Given 04/18/19 1122)    ED Course  I have reviewed the triage vital signs and the nursing notes.  Pertinent labs & imaging results that were available during my care of the patient were reviewed by me and considered in my medical decision making (see chart for details).    MDM Rules/Calculators/A&P                      Patient confused on arrival here with temperature of 103.9.  Code sepsis initiated with IV fluid resuscitation as well as  started on empiric antibiotics.  Urinalysis positive for infection.  Chest x-ray without acute findings.  Rapid Covid test negative.  Lactate is elevated 2.1.  Patient given Tylenol for temperature and has become more responsive and following commands.  Heart rate has improved after receiving fluids.  She moves her neck freely.  Patient to be admitted to the hospital  CRITICAL CARE Performed by: Toy Baker Total critical care time: 60 minutes Critical care time was exclusive of separately billable procedures and treating other patients. Critical care was necessary to treat or prevent imminent or life-threatening deterioration. Critical care was time spent personally by me on the following activities: development of treatment plan with patient and/or surrogate as well as nursing, discussions with consultants, evaluation of patient's response to treatment, examination of patient, obtaining history from patient or surrogate, ordering and performing treatments and interventions, ordering and review of laboratory studies, ordering and review of radiographic studies, pulse oximetry and re-evaluation of patient's condition.  Final Clinical Impression(s) / ED Diagnoses Final diagnoses:  None    Rx / DC Orders ED Discharge Orders    None       Lorre Nick, MD 04/18/19 1308

## 2019-04-19 ENCOUNTER — Inpatient Hospital Stay (HOSPITAL_COMMUNITY): Payer: Medicare Other

## 2019-04-19 DIAGNOSIS — B9620 Unspecified Escherichia coli [E. coli] as the cause of diseases classified elsewhere: Secondary | ICD-10-CM

## 2019-04-19 DIAGNOSIS — N12 Tubulo-interstitial nephritis, not specified as acute or chronic: Secondary | ICD-10-CM

## 2019-04-19 DIAGNOSIS — N390 Urinary tract infection, site not specified: Secondary | ICD-10-CM

## 2019-04-19 DIAGNOSIS — E785 Hyperlipidemia, unspecified: Secondary | ICD-10-CM

## 2019-04-19 DIAGNOSIS — Z87891 Personal history of nicotine dependence: Secondary | ICD-10-CM

## 2019-04-19 DIAGNOSIS — Z79899 Other long term (current) drug therapy: Secondary | ICD-10-CM

## 2019-04-19 DIAGNOSIS — J810 Acute pulmonary edema: Secondary | ICD-10-CM

## 2019-04-19 DIAGNOSIS — G4733 Obstructive sleep apnea (adult) (pediatric): Secondary | ICD-10-CM

## 2019-04-19 DIAGNOSIS — A4151 Sepsis due to Escherichia coli [E. coli]: Principal | ICD-10-CM

## 2019-04-19 DIAGNOSIS — N179 Acute kidney failure, unspecified: Secondary | ICD-10-CM

## 2019-04-19 DIAGNOSIS — J811 Chronic pulmonary edema: Secondary | ICD-10-CM

## 2019-04-19 DIAGNOSIS — Z794 Long term (current) use of insulin: Secondary | ICD-10-CM

## 2019-04-19 DIAGNOSIS — Z9884 Bariatric surgery status: Secondary | ICD-10-CM

## 2019-04-19 DIAGNOSIS — I10 Essential (primary) hypertension: Secondary | ICD-10-CM

## 2019-04-19 DIAGNOSIS — G9340 Encephalopathy, unspecified: Secondary | ICD-10-CM

## 2019-04-19 DIAGNOSIS — R6521 Severe sepsis with septic shock: Secondary | ICD-10-CM

## 2019-04-19 DIAGNOSIS — Z79890 Hormone replacement therapy (postmenopausal): Secondary | ICD-10-CM

## 2019-04-19 DIAGNOSIS — Z87440 Personal history of urinary (tract) infections: Secondary | ICD-10-CM

## 2019-04-19 DIAGNOSIS — E119 Type 2 diabetes mellitus without complications: Secondary | ICD-10-CM

## 2019-04-19 DIAGNOSIS — I361 Nonrheumatic tricuspid (valve) insufficiency: Secondary | ICD-10-CM

## 2019-04-19 DIAGNOSIS — R7881 Bacteremia: Secondary | ICD-10-CM

## 2019-04-19 DIAGNOSIS — E039 Hypothyroidism, unspecified: Secondary | ICD-10-CM

## 2019-04-19 DIAGNOSIS — Z970 Presence of artificial eye: Secondary | ICD-10-CM

## 2019-04-19 LAB — COMPREHENSIVE METABOLIC PANEL
ALT: 18 U/L (ref 0–44)
AST: 22 U/L (ref 15–41)
Albumin: 2.7 g/dL — ABNORMAL LOW (ref 3.5–5.0)
Alkaline Phosphatase: 84 U/L (ref 38–126)
Anion gap: 12 (ref 5–15)
BUN: 20 mg/dL (ref 8–23)
CO2: 21 mmol/L — ABNORMAL LOW (ref 22–32)
Calcium: 8.6 mg/dL — ABNORMAL LOW (ref 8.9–10.3)
Chloride: 103 mmol/L (ref 98–111)
Creatinine, Ser: 1.32 mg/dL — ABNORMAL HIGH (ref 0.44–1.00)
GFR calc Af Amer: 50 mL/min — ABNORMAL LOW (ref >60)
GFR calc non Af Amer: 43 mL/min — ABNORMAL LOW (ref >60)
Glucose, Bld: 219 mg/dL — ABNORMAL HIGH (ref 70–99)
Potassium: 3.7 mmol/L (ref 3.5–5.1)
Sodium: 136 mmol/L (ref 135–145)
Total Bilirubin: 1 mg/dL (ref 0.3–1.2)
Total Protein: 6.5 g/dL (ref 6.5–8.1)

## 2019-04-19 LAB — CBC
HCT: 31.7 % — ABNORMAL LOW (ref 36.0–46.0)
Hemoglobin: 10.2 g/dL — ABNORMAL LOW (ref 12.0–15.0)
MCH: 29.2 pg (ref 26.0–34.0)
MCHC: 32.2 g/dL (ref 30.0–36.0)
MCV: 90.8 fL (ref 80.0–100.0)
Platelets: 156 10*3/uL (ref 150–400)
RBC: 3.49 MIL/uL — ABNORMAL LOW (ref 3.87–5.11)
RDW: 13.7 % (ref 11.5–15.5)
WBC: 11.3 10*3/uL — ABNORMAL HIGH (ref 4.0–10.5)
nRBC: 0 % (ref 0.0–0.2)

## 2019-04-19 LAB — GLUCOSE, CAPILLARY
Glucose-Capillary: 196 mg/dL — ABNORMAL HIGH (ref 70–99)
Glucose-Capillary: 187 mg/dL — ABNORMAL HIGH (ref 70–99)
Glucose-Capillary: 178 mg/dL — ABNORMAL HIGH (ref 70–99)
Glucose-Capillary: 177 mg/dL — ABNORMAL HIGH (ref 70–99)

## 2019-04-19 LAB — ECHOCARDIOGRAM COMPLETE
Height: 70 in
Weight: 4659.64 oz

## 2019-04-19 LAB — CORTISOL-AM, BLOOD: Cortisol - AM: 24.1 ug/dL — ABNORMAL HIGH (ref 6.7–22.6)

## 2019-04-19 LAB — BRAIN NATRIURETIC PEPTIDE: B Natriuretic Peptide: 243.9 pg/mL — ABNORMAL HIGH (ref 0.0–100.0)

## 2019-04-19 LAB — PROCALCITONIN: Procalcitonin: 28.82 ng/mL

## 2019-04-19 LAB — PROTIME-INR
INR: 1.2 (ref 0.8–1.2)
Prothrombin Time: 15.4 seconds — ABNORMAL HIGH (ref 11.4–15.2)

## 2019-04-19 LAB — LACTIC ACID, PLASMA: Lactic Acid, Venous: 1.7 mmol/L (ref 0.5–1.9)

## 2019-04-19 MED ORDER — LEVOTHYROXINE SODIUM 50 MCG PO TABS
50.0000 ug | ORAL_TABLET | Freq: Every day | ORAL | Status: DC
  Administered 2019-04-19 – 2019-04-21 (×3): 50 ug via ORAL
  Filled 2019-04-19 (×3): qty 1

## 2019-04-19 MED ORDER — FUROSEMIDE 10 MG/ML IJ SOLN
40.0000 mg | Freq: Once | INTRAMUSCULAR | Status: AC
  Administered 2019-04-19: 40 mg via INTRAVENOUS
  Filled 2019-04-19: qty 4

## 2019-04-19 MED ORDER — DULOXETINE HCL 60 MG PO CPEP
60.0000 mg | ORAL_CAPSULE | Freq: Every day | ORAL | Status: DC
  Administered 2019-04-19 – 2019-04-21 (×3): 60 mg via ORAL
  Filled 2019-04-19 (×3): qty 1

## 2019-04-19 MED ORDER — CHLORHEXIDINE GLUCONATE CLOTH 2 % EX PADS
6.0000 | MEDICATED_PAD | Freq: Every day | CUTANEOUS | Status: DC
  Administered 2019-04-19 – 2019-04-20 (×2): 6 via TOPICAL

## 2019-04-19 MED ORDER — INSULIN GLARGINE 100 UNIT/ML ~~LOC~~ SOLN
10.0000 [IU] | Freq: Every day | SUBCUTANEOUS | Status: DC
  Administered 2019-04-19 – 2019-04-20 (×2): 10 [IU] via SUBCUTANEOUS
  Filled 2019-04-19 (×2): qty 0.1

## 2019-04-19 MED ORDER — INSULIN ASPART 100 UNIT/ML ~~LOC~~ SOLN
0.0000 [IU] | SUBCUTANEOUS | Status: DC
  Administered 2019-04-19 – 2019-04-20 (×4): 3 [IU] via SUBCUTANEOUS
  Administered 2019-04-20 – 2019-04-21 (×3): 5 [IU] via SUBCUTANEOUS
  Administered 2019-04-21 (×3): 3 [IU] via SUBCUTANEOUS

## 2019-04-19 NOTE — ED Notes (Signed)
Just took 1000 mL off temp foley

## 2019-04-19 NOTE — ED Notes (Signed)
Per MD LR infusion stopped for the present

## 2019-04-19 NOTE — Progress Notes (Signed)
The [facility]MT[facility] service call team received a page from [name]'s nurse in regards to the patient's mentation. Her nurse stated that she was going to give [name] her afternoon medications when she noticed that [name] seemed delayed during their conversation. Under further investigation, her nurse states that [name] was orientated to self, but not place or time. She states that [name] is doing better than when she was first admitted, but is more delayed than she was this morning.   Dr. Truman Hayward and Dr. Gilford Rile saw the patient in her room. Patient was lying comfortably in bed. During our conversation she was alert to herself, and general location (hospital but not [facility]), but not to year. Patient took frequent pauses during our conversation.   Physical:  BP: 166/75 HR: 104 RR 30s SpO2 96  Physical Exam Constitutional:      General: She is not in acute distress.    Appearance: She is obese. She is not ill-appearing or toxic-appearing.     Comments: Laying comfortably in bed. No acute distress.   HENT:     Head: Normocephalic and atraumatic.  Cardiovascular:     Rate and Rhythm: Tachycardia present.     Pulses: Normal pulses.     Heart sounds: Normal heart sounds. No murmur. No friction rub. No gallop.   Pulmonary:     Breath sounds: Rales present. No wheezing.     Comments: Tachypneic with no accessory muscle usage. Bibasilar rales appreciated on auscultation.  Abdominal:     General: Bowel sounds are normal.     Tenderness: There is no abdominal tenderness. There is no guarding.  Neurological:     Mental Status: She is alert.     Comments: AAOx2  Psychiatric:     Comments: Frequent pauses during conversations, forgetful.     Plan:  Obtain Chest Xray as patient has had multiple fluid boluses and sounds volume overloaded on physical exam. Pending results will order 40 mg IV injection.  - Continue to monitor

## 2019-04-19 NOTE — Progress Notes (Addendum)
Per RN, patient noted to be tachypnic w/ RR in 40's. Patient evaluated at bedside. She is alert and oriented and responding appropriately. She reports feeling well overall but does note a mild headache and slight trouble breathing. She is on 3L [place] and saturating at 96-97%. She is noted to be tachypnic in 30's with shallow breathing. RR improved with deep breathing. On auscultation, mild bibasilar crackles noted.  Patient has received ~4L of fluids and is on LR 75 cc/hr. She is hemodynamically stable at this time. Suspect her tachypnea may be from fluid overload. CXR from earlier today with cardiomegaly and vascular congestion.  At this time, will hold fluids and obtain CXR STAT.   ADDENDUM: CXR suggestive of volume overload. Patient given one dose lasix 40mg  with 1L UOP and improvement in symptoms.   Harvie Heck, MD Internal Medicine, PGY-1 Pager: [phone]

## 2019-04-19 NOTE — Progress Notes (Addendum)
NAME:  Nicole Bridges, MRN:  [id number], DOB:  [birth year], LOS: 1 ADMISSION DATE:  04/18/2019, Primary: Babs Bertin, Odette Fraction., MD  CHIEF COMPLAINT:  Altered mental status   Brief History  Nicole Bridges is a 63 yo female who presented to the [facility] ED on 04/18/19 with one day history of altered mental status and was found to be septic on arrival. She was in her usual state of health up until morning of admission when she developed sudden onset of feeling warm, flank pain and sudden change in mental status.  Subjective/interm history  IVFs stopped overnight as patient developed tachypnea. O2 sats remained stable. Repeat CXR obtained and showed features of CHF/volume overload with cardiomegaly and pulm edema Mental status much improved this morning. Able to carry on a conversation. Discussed cause of illness and answered all questions.   Significant Hospital Events   12/24 admission.  12/25>BCx4 + for e.coli  Objective   Blood pressure 131/68, pulse 96, temperature 98.7 F (37.1 C), temperature source Oral, resp. rate (!) 24, height 5\' 10"  (1.778 m), weight 132.1 kg, SpO2 100 %.     Intake/Output Summary (Last 24 hours) at 04/19/2019 1012 Last data filed at 04/19/2019 0700 Gross per 24 hour  Intake 4071.2 ml  Output 3001 ml  Net 1070.2 ml   Filed Weights   04/18/19 1120 04/19/19 0137  Weight: 136.1 kg 132.1 kg    Examination: GENERAL: in no acute distress CARDIAC: heart RRR. No peripheral edema.  PULMONARY: breathing comfortably. Minimal bibasilar crackles ABDOMEN: bs active. nontender to palpation. NEURO: alert and oriented  Significant Diagnostic Tests:  12/24 CXR> no overt edema or opacities. Cardiomegaly, vascular congestion 12/25 CXR> progressive pulm edema suggestive of CHF/volume overload  Micro Data:  12/24 Blood culture>>4/4 E.Coli  Antimicrobials:  Vancomycin 12/24>12/24 Rocephin 12/24>>  Labs    CBC Latest Ref Rng & Units 04/19/2019 04/18/2019 03/30/2011   WBC 4.0 - 10.5 K/uL 11.3(H) 7.4 5.0  Hemoglobin 12.0 - 15.0 g/dL 10.2(L) 11.9(L) 11.6(L)  Hematocrit 36.0 - 46.0 % 31.7(L) 37.7 35.0(L)  Platelets 150 - 400 K/uL 156 162 205   BMP Latest Ref Rng & Units 04/19/2019 04/18/2019 03/30/2011  Glucose 70 - 99 mg/dL 219(H) 272(H) -  BUN 8 - 23 mg/dL 20 22 -  Creatinine 0.44 - 1.00 mg/dL 1.32(H) 1.40(H) 0.78  Sodium 135 - 145 mmol/L 136 133(L) -  Potassium 3.5 - 5.1 mmol/L 3.7 4.2 -  Chloride 98 - 111 mmol/L 103 99 -  CO2 22 - 32 mmol/L 21(L) 20(L) -  Calcium 8.9 - 10.3 mg/dL 8.6(L) 8.9 -   Lactic acid 2.1>>1.7  Summary  Nicole Bridges is a 63 yo female with PMH of T2DM, hypothyroidism and hypertension who presented to the [facility] ED on 04/18/19 for altered mental status and was subsequently admitted to IMTS for urosepsis management.  Assessment & Plan:  Active Problems:   Sepsis ([place])   AKI (acute kidney injury) ([place])   E coli bacteremia   Acute pulmonary edema (H[facility])  Sepsis/urosepsis. BC 4/4 for e.coli. UA consistent with UTI.  Blood pressures were trending down since arrival to ED requiring 4L of LR. Fluids stopped overnight due to volume overload symptoms and hypotension resolved. Fever of 104 on arrival which has since resolved. Lactic acidosis resolved. Switched to rocephin overnight for + e. Coli bacteremia Acute encepholopathy improving.  AKI. Suspect this will continue to improve with infection control. Urine culture pending Plan Continue rocephin--day #2/7 Repeat blood cultures  pending  Pulmonary edema. Became tachypneic overnight. Repeat CXR suggested progression of pulm edema since admission--likely due to 4+L of IVF she received which have since been turned off. Lung exam appears improved this morning. No recent echo available to evaluate for component of heart failure. Will obtain echo for further evaluation.  Chronic Hypertension. Home medications: enalapril-hctz 5-12.5mg  daily, hctz 25mg  daily, metoprolol succinate 25mg   daily. Will continue to monitor blood pressures throughout the day and restart home antihypertensives as indicated.  Chronic T2DM. Home medications: glipizide 10mg  daily, toujeo 70U daily, SSI with humalog, metformin 1000mg  BID, trulicity 1.5mg  weekly. A1C 8.7. blood sugars remain fairly elevated--likely a reactive component as well. Increased SSI to moderate. Started 10U lantus HS   Chronic OSA. CPAP qHS  Best practice:  CODE STATUS: full Diet: diabetic, cardiac DVT for prophylaxis: lovenox Dispo: pending medical stabilization   , MD INTERNAL MEDICINE RESIDENT PGY-1 PAGER #: [phone] 04/19/19 10:12 AM

## 2019-04-19 NOTE — Progress Notes (Signed)
Internal Medicine Attending Note:  I have seen and evaluated this patient and I have discussed the plan of care with the house staff. Please see their note for complete details. I concur with their findings.  Please see my attested H&P from earlier today.   Velna Ochs, MD 04/19/2019, 11:37 AM

## 2019-04-19 NOTE — Progress Notes (Signed)
Echocardiogram 2D Echocardiogram has been performed.  Oneal Deputy Toriano Aikey 04/19/2019, 10:05 AM

## 2019-04-20 ENCOUNTER — Other Ambulatory Visit: Payer: Self-pay

## 2019-04-20 LAB — CBC
HCT: 31.5 % — ABNORMAL LOW (ref 36.0–46.0)
Hemoglobin: 10.4 g/dL — ABNORMAL LOW (ref 12.0–15.0)
MCH: 29.1 pg (ref 26.0–34.0)
MCHC: 33 g/dL (ref 30.0–36.0)
MCV: 88 fL (ref 80.0–100.0)
Platelets: 155 10*3/uL (ref 150–400)
RBC: 3.58 MIL/uL — ABNORMAL LOW (ref 3.87–5.11)
RDW: 13.4 % (ref 11.5–15.5)
WBC: 7.8 10*3/uL (ref 4.0–10.5)
nRBC: 0 % (ref 0.0–0.2)

## 2019-04-20 LAB — GLUCOSE, CAPILLARY
Glucose-Capillary: 150 mg/dL — ABNORMAL HIGH (ref 70–99)
Glucose-Capillary: 186 mg/dL — ABNORMAL HIGH (ref 70–99)
Glucose-Capillary: 191 mg/dL — ABNORMAL HIGH (ref 70–99)
Glucose-Capillary: 247 mg/dL — ABNORMAL HIGH (ref 70–99)
Glucose-Capillary: 226 mg/dL — ABNORMAL HIGH (ref 70–99)

## 2019-04-20 LAB — BASIC METABOLIC PANEL
Anion gap: 13 (ref 5–15)
BUN: 11 mg/dL (ref 8–23)
CO2: 26 mmol/L (ref 22–32)
Calcium: 8.5 mg/dL — ABNORMAL LOW (ref 8.9–10.3)
Chloride: 99 mmol/L (ref 98–111)
Creatinine, Ser: 0.99 mg/dL (ref 0.44–1.00)
GFR calc Af Amer: >60 mL/min (ref >60)
GFR calc non Af Amer: >60 mL/min (ref >60)
Glucose, Bld: 166 mg/dL — ABNORMAL HIGH (ref 70–99)
Potassium: 3.2 mmol/L — ABNORMAL LOW (ref 3.5–5.1)
Sodium: 138 mmol/L (ref 135–145)

## 2019-04-20 LAB — CULTURE, BLOOD (ROUTINE X 2)
Special Requests: ADEQUATE
Special Requests: ADEQUATE

## 2019-04-20 LAB — URINE CULTURE: Culture: 10000 — AB

## 2019-04-20 MED ORDER — HYDROCHLOROTHIAZIDE 25 MG PO TABS
25.0000 mg | ORAL_TABLET | Freq: Every morning | ORAL | Status: DC
  Administered 2019-04-20 – 2019-04-21 (×2): 25 mg via ORAL
  Filled 2019-04-20 (×2): qty 1

## 2019-04-20 MED ORDER — HYDROCHLOROTHIAZIDE 25 MG PO TABS: 25.0000 mg | ORAL_TABLET | ORAL | Status: DC

## 2019-04-20 MED ORDER — POTASSIUM CHLORIDE 20 MEQ PO PACK: 40.0000 meq | PACK | Freq: Two times a day (BID) | ORAL | Status: DC

## 2019-04-20 MED ORDER — POTASSIUM CHLORIDE 20 MEQ/15ML (10%) PO SOLN
40.0000 meq | Freq: Once | ORAL | Status: AC
  Administered 2019-04-20: 40 meq via ORAL
  Filled 2019-04-20: qty 30

## 2019-04-20 NOTE — Progress Notes (Addendum)
NAME:  Nicole Bridges, MRN:  [id number], DOB:  21-Dec-1955, LOS: 2 ADMISSION DATE:  04/18/2019, Primary: Paschal Dopp, Sheliah Plane., MD  CHIEF COMPLAINT:  Altered mental status   Subjective/interm history  Developed decline in mental status overnight. Seen by night IMTS team at bedside. She was alert but oriented only to location.  Mentation much improved this morning. Discussed tolieting habits which she notes she sometimes will go all day without urinating. Discussed how this can increase risk for UTIs. Encouraged her to set an alarm on her phone and try to urinate q2h to avoid urinary retention>>UTIs.  Significant Hospital Events   12/24 admission.  12/25>BCx4 + for e.coli, improved mental status and general medical condition  Objective   Blood pressure (!) 151/66, pulse 100, temperature 99.6 F (37.6 C), temperature source Oral, resp. rate 18, height 5\' 10"  (1.778 m), weight 132.1 kg, SpO2 97 %.     Intake/Output Summary (Last 24 hours) at 04/20/2019 0510 Last data filed at 04/19/2019 1800 Gross per 24 hour  Intake 1540 ml  Output 5850 ml  Net -4310 ml   Filed Weights   04/18/19 1120 04/19/19 0137  Weight: 136.1 kg 132.1 kg    Examination: GENERAL: in no acute distress CARDIAC: heart RRR.  PULMONARY: breathing comfortably. Minimal bibasilar crackles ABDOMEN: bs active. nontender to palpation. NEURO: a/o x3.   Significant Diagnostic Tests:  12/24 CXR> no overt edema or opacities. Cardiomegaly, vascular congestion 12/25 CXR> progressive pulm edema suggestive of CHF/volume overload  12/25 echo>  LVEF 60-65%. Mild LVH. Grade one diastolic dysfunction. Normal left atrial size. Degenerative MV. No valvular abnormalities. Moderately elevated PA systolic pressure. IVC <50% varriability suggesting RAP of 1/26.  Micro Data:  12/24 Blood culture>>4/4 E.Coli  Antimicrobials:  Vancomycin 12/24>12/24 Rocephin 12/24>>  Labs    CBC Latest Ref Rng & Units 04/19/2019 04/18/2019  03/30/2011  WBC 4.0 - 10.5 K/uL 11.3(H) 7.4 5.0  Hemoglobin 12.0 - 15.0 g/dL 10.2(L) 11.9(L) 11.6(L)  Hematocrit 36.0 - 46.0 % 31.7(L) 37.7 35.0(L)  Platelets 150 - 400 K/uL 156 162 205   BMP Latest Ref Rng & Units 04/19/2019 04/18/2019 03/30/2011  Glucose 70 - 99 mg/dL 14/08/2010) 338(S) -  BUN 8 - 23 mg/dL 20 22 -  Creatinine 505(L - 1.00 mg/dL 9.76) 7.34(L) 9.37(T  Sodium 135 - 145 mmol/L 136 133(L) -  Potassium 3.5 - 5.1 mmol/L 3.7 4.2 -  Chloride 98 - 111 mmol/L 103 99 -  CO2 22 - 32 mmol/L 21(L) 20(L) -  Calcium 8.9 - 10.3 mg/dL 0.24) 8.9 -    Summary  Nicole Bridges is a 63 yo female with PMH of T2DM, hypothyroidism and hypertension who presented to the [facility] ED on 04/18/19 for altered mental status and was subsequently admitted to IMTS for urosepsis management.  Assessment & Plan:  Active Problems:   Sepsis (HCC)   AKI (acute kidney injury) (HCC)   E coli bacteremia   Acute pulmonary edema (HCC)  Sepsis secondary to pyelonephritis. BC 4/4 for pan-sensative e.coli. UA consistent with UTI. UC with gram neg rods.  Still intermittently febrile. Hypotension and leukocytosis resolved. AKI resolved. Acute encepholopathy resolved. A/o x3 this morning. Mildly hypokalemic. K 3.2 this morning. Likely 2/2 diuresis. Plan Continue rocephin--day #3/10 04/20/19 K. Will recheck bmp in am  Pulmonary edema. Echo significant for grade 1 diastolic dysfunction and mild LVH. PA systolic pressures elevated--likely in setting of volume overload. She seems to be self diuresing well. Blood pressures are up so  I don't suspect she should need more fluids but will try to hold off on any extra fluids for now anyways.  Chronic Hypertension. Home medications: enalapril-hctz 5-12.5mg  daily, hctz 25mg  daily, metoprolol succinate 25mg  daily. Blood pressures trending up. Will restart home dose of 25mg  hctz today which should also help with diuresis.  Chronic T2DM. Home medications: glipizide 10mg  daily, toujeo 70U  daily, SSI with humalog, metformin 1000mg  BID, trulicity 1.5mg  weekly. A1C 8.7. blood sugars improving with yesterdays changes. Moderate sliding scale. 10U lantus HS   Chronic OSA. CPAP qHS  Best practice:  CODE STATUS: full Diet: diabetic, cardiac DVT for prophylaxis: lovenox Dispo: if she remains stable, can likely discharge in next 1-2 days. Will need to complete 10d course of abx for pyelonephritis  Mitzi Hansen, MD INTERNAL MEDICINE RESIDENT PGY-1 PAGER #: [phone] 04/20/19 5:10 AM

## 2019-04-20 NOTE — Progress Notes (Signed)
RT offered pt CPAP for the night and pt declined at this time. RT expressed to pt that if she changes her mind to please call for placement. RT will continue to monitor.

## 2019-04-20 NOTE — Progress Notes (Signed)
Internal Medicine Attending Note:  This patient's plan of care was discussed with the house staff. Please see their note for complete details. I concur with their findings.   Velna Ochs, MD 04/20/2019, 1:58 PM

## 2019-04-21 DIAGNOSIS — J9600 Acute respiratory failure, unspecified whether with hypoxia or hypercapnia: Secondary | ICD-10-CM

## 2019-04-21 DIAGNOSIS — N12 Tubulo-interstitial nephritis, not specified as acute or chronic: Secondary | ICD-10-CM

## 2019-04-21 DIAGNOSIS — E876 Hypokalemia: Secondary | ICD-10-CM

## 2019-04-21 DIAGNOSIS — N390 Urinary tract infection, site not specified: Secondary | ICD-10-CM

## 2019-04-21 LAB — BASIC METABOLIC PANEL
Anion gap: 11 (ref 5–15)
BUN: 9 mg/dL (ref 8–23)
CO2: 27 mmol/L (ref 22–32)
Calcium: 8.9 mg/dL (ref 8.9–10.3)
Chloride: 96 mmol/L — ABNORMAL LOW (ref 98–111)
Creatinine, Ser: 0.86 mg/dL (ref 0.44–1.00)
GFR calc Af Amer: >60 mL/min (ref >60)
GFR calc non Af Amer: >60 mL/min (ref >60)
Glucose, Bld: 180 mg/dL — ABNORMAL HIGH (ref 70–99)
Potassium: 3.1 mmol/L — ABNORMAL LOW (ref 3.5–5.1)
Sodium: 134 mmol/L — ABNORMAL LOW (ref 135–145)

## 2019-04-21 LAB — GLUCOSE, CAPILLARY
Glucose-Capillary: 164 mg/dL — ABNORMAL HIGH (ref 70–99)
Glucose-Capillary: 170 mg/dL — ABNORMAL HIGH (ref 70–99)
Glucose-Capillary: 188 mg/dL — ABNORMAL HIGH (ref 70–99)
Glucose-Capillary: 219 mg/dL — ABNORMAL HIGH (ref 70–99)

## 2019-04-21 LAB — CBC
HCT: 31.5 % — ABNORMAL LOW (ref 36.0–46.0)
Hemoglobin: 10.4 g/dL — ABNORMAL LOW (ref 12.0–15.0)
MCH: 28.9 pg (ref 26.0–34.0)
MCHC: 33 g/dL (ref 30.0–36.0)
MCV: 87.5 fL (ref 80.0–100.0)
Platelets: 168 10*3/uL (ref 150–400)
RBC: 3.6 MIL/uL — ABNORMAL LOW (ref 3.87–5.11)
RDW: 13.6 % (ref 11.5–15.5)
WBC: 7.5 10*3/uL (ref 4.0–10.5)
nRBC: 0 % (ref 0.0–0.2)

## 2019-04-21 MED ORDER — INSULIN GLARGINE 100 UNIT/ML ~~LOC~~ SOLN
15.0000 [IU] | Freq: Every day | SUBCUTANEOUS | Status: DC
  Filled 2019-04-21: qty 0.15

## 2019-04-21 MED ORDER — AMOXICILLIN-POT CLAVULANATE 875-125 MG PO TABS: 1.0000 | ORAL_TABLET | Freq: Two times a day (BID) | ORAL | 0 refills | Status: AC

## 2019-04-21 MED ORDER — POTASSIUM CHLORIDE 20 MEQ PO PACK
40.0000 meq | PACK | ORAL | Status: AC
  Administered 2019-04-21 (×2): 40 meq via ORAL
  Filled 2019-04-21 (×2): qty 2

## 2019-04-21 MED ORDER — LEVOTHYROXINE SODIUM 50 MCG PO TABS: 50.0000 ug | ORAL_TABLET | Freq: Every day | ORAL | Status: AC

## 2019-04-21 MED ORDER — METOPROLOL SUCCINATE ER 25 MG PO TB24
25.0000 mg | ORAL_TABLET | Freq: Every day | ORAL | Status: DC
  Administered 2019-04-21: 25 mg via ORAL
  Filled 2019-04-21: qty 1

## 2019-04-21 NOTE — Progress Notes (Signed)
Internal Medicine Attending Note:  I have seen and evaluated this patient and I have discussed the plan of care with the house staff. Please see their note for complete details. I concur with their findings.  Significantly improved today. Sitting up in chair eating breakfast, feels ready for discharge. Plan to discharge home today on oral antibiotics to complete a 10 day course for her pyelonephritis and E coli bacteremia.   Velna Ochs, MD 04/21/2019, 1:03 PM

## 2019-04-21 NOTE — Progress Notes (Signed)
RT placed pt on CPAP dream station for the night on her home setting of 9 cm H2O w/5 Lpm bled into the system. Pts respiratory status is stable at this time. RT will continue to monitor.

## 2019-04-21 NOTE — TOC Transition Note (Addendum)
Transition of Care [facility]) - CM/SW Discharge Note   Patient Details  Name: DELTHA BERNALES MRN: [id number] Date of Birth: 19-Nov-1955  Transition of [facility] CM/SW Contact:  Claudie Leach, RN 04/21/2019, 2:12 PM      Final next level of care: Home/Self Care      Discharge Plan and Services                DME Arranged: Gilford Rile rolling DME Agency: [employer] Date DME Agency Contacted: 04/21/19 Time DME Agency Contacted: [phone] Representative spoke with at DME Agency: [place]    Patient's insurance could not be verified by [employer].  Patient would have to pay cash and would rather buy from [place] or [place].

## 2019-04-21 NOTE — Discharge Summary (Signed)
Name: FREDI HURTADO MRN: [id number] DOB: May 05, 1955 63 y.o. PCP: Carmin Richmond., MD  Date of Admission: 04/18/2019 11:08 AM Date of Discharge:  Attending Physician: Reymundo Poll, MD  Discharge Diagnosis: 1. Sepsis 2. Pyelonephritis  3. Acute respiratory failure 2/2 hypervolemia  Discharge Medications: Allergies as of 04/21/2019   No Known Allergies     Medication List    STOP taking these medications   rosuvastatin 5 MG tablet Commonly known as: CRESTOR     TAKE these medications   amoxicillin-clavulanate 875-125 MG tablet Commonly known as: Augmentin Take 1 tablet by mouth 2 (two) times daily for 6 days.   buPROPion 150 MG 12 hr tablet Commonly known as: WELLBUTRIN SR Take 150 mg by mouth 2 (two) times daily.   buPROPion 300 MG 24 hr tablet Commonly known as: WELLBUTRIN XL Take 300 mg by mouth every morning.   cyclobenzaprine 10 MG tablet Commonly known as: FLEXERIL Take 10 mg by mouth at bedtime.   diclofenac 75 MG EC tablet Commonly known as: VOLTAREN Take 75 mg by mouth 2 (two) times daily.   DULoxetine 60 MG capsule Commonly known as: CYMBALTA Take 60 mg by mouth daily.   Enalapril-hydroCHLOROthiazide 5-12.5 MG tablet Take 1 tablet by mouth daily.   gabapentin 300 MG capsule Commonly known as: NEURONTIN Take 300 mg by mouth 2 (two) times daily.   glipiZIDE 10 MG tablet Commonly known as: GLUCOTROL Take 10 mg by mouth daily.   HumaLOG KwikPen 100 UNIT/ML KwikPen Generic drug: insulin lispro Inject 4-14 Units into the skin 4 (four) times daily. 150-200=4u,201-300=6u,301-350=8u,351-400=10u,401-450=12u,451-500=14u, high BS protocol 2 units every hour until below 500   hydrochlorothiazide 25 MG tablet Commonly known as: HYDRODIURIL Take 25 mg by mouth every morning.   levothyroxine 50 MCG tablet Commonly known as: SYNTHROID Take 1 tablet (50 mcg total) by mouth daily at 6 (six) AM. Start taking on: April 22, 2019 What changed:    how much to take  when to take this   Lexapro 20 MG tablet Generic drug: escitalopram Take 20 mg by mouth every morning.   metFORMIN 1000 MG tablet Commonly known as: GLUCOPHAGE Take 1,000 mg by mouth 2 (two) times daily with a meal.   metoprolol succinate 25 MG 24 hr tablet Commonly known as: TOPROL-XL Take 25 mg by mouth daily.   omeprazole 40 MG capsule Commonly known as: PRILOSEC Take 40 mg by mouth daily.   risperiDONE 1 MG tablet Commonly known as: RISPERDAL Take 1 mg by mouth at bedtime.   simvastatin 20 MG tablet Commonly known as: ZOCOR Take 20 mg by mouth at bedtime.   solifenacin 10 MG tablet Commonly known as: VESICARE Take 10 mg by mouth daily.   Toujeo SoloStar 300 UNIT/ML Sopn Generic drug: Insulin Glargine (1 Unit Dial) Inject 70 Units into the skin daily.   traZODone 100 MG tablet Commonly known as: DESYREL Take 100 mg by mouth at bedtime.   Trulicity 1.5 MG/0.5ML Sopn Generic drug: Dulaglutide Inject 1.5 mg into the skin once a week.       Disposition and follow-up:   Ms.Shilee L Tucholski was discharged from [facility] in Stable condition.  At the hospital follow up visit please address:  Urosepsis 2/2 pyelonephritis. Blood and urine cultures + for pan-sensitive e.coli. Please re-evaluate for fever, chills, dysuria, flank pain at time of follow up. 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 We discussed setting  a timer to make sure she is getting up to toliet q2h during the day. Please continue to encourage this. She was also hypokalemic and received K replacement so please recheck a BMP at time of follow up.  Acute respiratory failure 2/2 pulmonary edema 2/2 hypervolemia from numerous IV boluses for sepsis. Echo not remarkable for heart failure component. Weaned to room air at time of discharge and was self diuresing well. Please ensure that she is continuing to  breath comfortable and has no outward signs of hypervolemia.  Follow-up Appointments: Follow-up Information    Carmin RichmondHama Amin, Ali M., MD Follow up in 1 week(s).   Specialty: Internal Medicine Contact information: [address] [place] [id number] [phone]           Hospital Course by problem list: Alesia RichardsCheryl Malmquist is a 63 yo female with a past medical history of T2DM and recurrent episodes of urosepsis 2/2 pyelonephritis who presented to the ED on 04/18/19 with acute onset altered mental status and flank pain. On arrival, she was febrile (Tmax 104) and UA indicitive of infectious source. While in the ED, her sepsis progressed with decreasing blood pressures requiring several fluid boluses and resolution of hypotension. Blood and urine cultures were obtained and were found to be positive for pan-sensitive e.coli. On day of discharge, she had been afebrile for >24h and hypotension had resolved . Her mental status returned to what her daughter had described as her baseline. She underwent a 4d course of IV ceftriaxone during her hospitalization and was instructed to continue augmentin for another 6d at time of discharge to complete a total of 10 days of antibiotics for pyelonephritis and bacteremia.  On admission, I discussed the recurrent kidney infections with her daughter who notes that she has an issue with urinary retention that is likely attributable, at least in part, to a psychological component. She often goes an entire day without tolieting and wears depends. Over her admission, I reiterated the importance of not retaining urine and we discussed a tolieting schedule.   Due to the amount of fluid she received on admission, she did develop a period of acute respiratory failure secondary to pulmonary edema. Chest xray showed some cardiomegaly and edema. She received a couple of doses of lasix and diuresed well. Echocardiogram was obtained while hospitalized to rule out a cardiogenic  component of hypervolemia. Echo only remarkable for a grade one diastolic dysfuntion and a moderately elevated PA pressure which was likely due to be hypervolemic at that time. On day of discharge, she was breathing comfortably on room air and was not hypoxic.   Discharge Vitals:   BP (!) 152/66 (BP Location: Right Arm)   Pulse 86   Temp 99.1 F (37.3 C) (Oral)   Resp (!) 26   Ht 5\' 10"  (1.778 m) Comment: per patient   Wt 126.6 kg   SpO2 99%   BMI 40.06 kg/m   Pertinent Labs, Studies, and Procedures:  CBC Latest Ref Rng & Units 04/21/2019 04/20/2019 04/19/2019  WBC 4.0 - 10.5 K/uL 7.5 7.8 11.3(H)  Hemoglobin 12.0 - 15.0 g/dL 10.4(L) 10.4(L) 10.2(L)  Hematocrit 36.0 - 46.0 % 31.5(L) 31.5(L) 31.7(L)  Platelets 150 - 400 K/uL 168 155 156   BMP Latest Ref Rng & Units 04/21/2019 04/20/2019 04/19/2019  Glucose 70 - 99 mg/dL 469(G180(H) 295(M166(H) 841(L219(H)  BUN 8 - 23 mg/dL 9 11 20   Creatinine 0.44 - 1.00 mg/dL 2.440.86 0.100.99 2.72(Z1.32(H)  Sodium 135 - 145 mmol/L 134(L) 138 136  Potassium 3.5 - 5.1 mmol/L 3.1(L) 3.2(L) 3.7  Chloride 98 - 111 mmol/L 96(L) 99 103  CO2 22 - 32 mmol/L 27 26 21(L)  Calcium 8.9 - 10.3 mg/dL 8.9 8.5(L) 8.6(L)   Blood cultures>4/4 e.coli Urine culture>e.coli 12/24 CXR> no overt edema or opacities. Cardiomegaly, vascular congestion 12/25 CXR> progressive pulm edema suggestive of CHF/volume overload  12/25 echo>  LVEF 60-65%. Mild LVH. Grade one diastolic dysfunction. Normal left atrial size. Degenerative MV. No valvular abnormalities. Moderately elevated PA systolic pressure. IVC <50% varriability suggesting RAP of 44mmHg.  Signed: Mitzi Hansen, MD 04/21/2019, 12:59 PM   Pager: [phone]

## 2019-04-21 NOTE — Evaluation (Signed)
Physical Therapy Evaluation Patient Details Name: Nicole Bridges MRN: [id number] DOB: [birth year] Today's Date: 04/21/2019   History of Present Illness  Patient is a 63 y/o female who presents with AMS. Found to have sepsis secondary to pyelonephritis and Acute respiratory failure 2/2 hypervolemia. PMH includes HTN, HLD, depression, COPD, DM.  Clinical Impression  Patient presents with generalized weakness, impaired vision, impaired balance, decreased activity tolerance and impaired mobility s/p above. Pt reports living with her spouse and is Mod I for ambulation walking short household distances with [facility] for support. Has difficulty with LB dressing and reports she has someone come in to assist with IADLs. Today, pt tolerated short distance ambulation with Min guard assist for balance/safety and cues for RW management. Reports feeling very tired but close to functional baseline. Will follow acutely to maximize independence and mobility prior to return home.    Follow Up Recommendations No PT follow up;Supervision for mobility/OOB(declines HH services)    Equipment Recommendations  Rolling walker with 5" wheels    Recommendations for Other Services       Precautions / Restrictions Precautions Precautions: Fall Precaution Comments: blind left eye Restrictions Weight Bearing Restrictions: No      Mobility  Bed Mobility               General bed mobility comments: Up in chair upon PT arrival.  Transfers Overall transfer level: Needs assistance Equipment used: Rolling walker (2 wheeled) Transfers: Sit to/from Stand Sit to Stand: Supervision         General transfer comment: Supervision for safety. Stood from Automotive engineer.  Ambulation/Gait Ambulation/Gait assistance: Min guard Gait Distance (Feet): 40 Feet Assistive device: Rolling walker (2 wheeled) Gait Pattern/deviations: Step-through pattern;Decreased stride length;Trunk flexed Gait velocity: decreased   General Gait  Details: Slow, mostly steady gait wtih cues for RW management/proximity. VSS. Reports feeling tired.  Stairs            Wheelchair Mobility    Modified Rankin (Stroke Patients Only)       Balance Overall balance assessment: Needs assistance Sitting-balance support: Feet supported;No upper extremity supported Sitting balance-Leahy Scale: Fair     Standing balance support: During functional activity Standing balance-Leahy Scale: Poor Standing balance comment: Requires UE support for dynamic standing/walking.                             Pertinent Vitals/Pain Pain Assessment: No/denies pain    Home Living Family/patient expects to be discharged to:: Private residence Living Arrangements: Spouse/significant other Available Help at Discharge: Personal care attendant;Available PRN/intermittently Type of Home: House Home Access: Stairs to enter Entrance Stairs-Rails: Right Entrance Stairs-Number of Steps: 3 Home Layout: One level Home Equipment: Cane - single point      Prior Function Level of Independence: Independent with assistive device(s)         Comments: Someone comes in and Assists with IADLs. Uses SPC as needed for ambulation. DIfficulty with LB dressing. Short distance household ambulator.     Hand Dominance   Dominant Hand: Right    Extremity/Trunk Assessment   Upper Extremity Assessment Upper Extremity Assessment: Defer to OT evaluation    Lower Extremity Assessment Lower Extremity Assessment: Generalized weakness(but functional)    Cervical / Trunk Assessment Cervical / Trunk Assessment: Normal  Communication   Communication: No difficulties  Cognition Arousal/Alertness: Awake/alert Behavior During Therapy: WFL for tasks assessed/performed Overall Cognitive Status: No family/caregiver present to determine baseline cognitive functioning  General Comments: Does not know why she was in  the hospital. Slower processing. Reports feeling close to functional baseline. "some days are better than others"      General Comments General comments (skin integrity, edema, etc.): VSS. 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 Difficulty reading smaller fonts on menu and far away.    Exercises     Assessment/Plan    PT Assessment Patient needs continued PT services  PT Problem List Decreased strength;Decreased mobility;Decreased safety awareness;Decreased balance;Decreased knowledge of use of DME;Decreased activity tolerance;Decreased cognition       PT Treatment Interventions Therapeutic activities;Gait training;Therapeutic exercise;Patient/family education;Balance training;Functional mobility training;Stair training;DME instruction    PT Goals (Current goals can be found in the Care Plan section)  Acute Rehab PT Goals Patient Stated Goal: to go home today PT Goal Formulation: With patient Time For Goal Achievement: 05/05/19 Potential to Achieve Goals: Good    Frequency Min 3X/week   Barriers to discharge Decreased caregiver support alone most of day    Co-evaluation               AM-PAC PT "6 Clicks" Mobility  Outcome Measure Help needed turning from your back to your side while in a flat bed without using bedrails?: A Little Help needed moving from lying on your back to sitting on the side of a flat bed without using bedrails?: A Little Help needed moving to and from a bed to a chair (including a wheelchair)?: A Little Help needed standing up from a chair using your arms (e.g., wheelchair or bedside chair)?: None Help needed to walk in hospital room?: A Little Help needed climbing 3-5 steps with a railing? : A Little 6 Click Score: 19    End of Session Equipment Utilized During Treatment: Gait belt Activity Tolerance: Patient tolerated treatment well Patient left: in chair;with call bell/phone within reach;with chair  alarm set Nurse Communication: Mobility status PT Visit Diagnosis: Muscle weakness (generalized) (M62.81);Unsteadiness on feet (R26.81)    Time: 1339-1400 PT Time Calculation (min) (ACUTE ONLY): 21 min   Charges:   PT Evaluation $PT Eval Moderate Complexity: 1 Mod          Vale Haven, PT, DPT Acute Rehabilitation Services Pager [phone] Office [phone]      Nicole Bridges 04/21/2019, 2:07 PM

## 2019-04-21 NOTE — Progress Notes (Signed)
NAME:  Nicole Bridges, MRN:  [id number], DOB:  07/03/1955, LOS: 3 ADMISSION DATE:  04/18/2019, Primary: Babs Bertin, Odette Fraction., MD  CHIEF COMPLAINT:  Altered mental status   Subjective/interm history  No overnight events. On room air now this morning. Discussed plan for discharge today with her and her husband via telephone. Both are agreeable with this. RN does note some concern about gait safety.  Objective   Blood pressure (!) 155/72, pulse 92, temperature 99.8 F (37.7 C), temperature source Oral, resp. rate 14, height 5\' 10"  (1.778 m), weight 126.6 kg, SpO2 91 %.     Intake/Output Summary (Last 24 hours) at 04/21/2019 0444 Last data filed at 04/21/2019 0254 Gross per 24 hour  Intake 253 ml  Output 3250 ml  Net -2997 ml   Filed Weights   04/19/19 0137 04/20/19 0622 04/21/19 0248  Weight: 132.1 kg 129.1 kg 126.6 kg    Examination: GENERAL: in no acute distress CARDIAC: heart RRR.  PULMONARY: breathing comfortably on RA. Minimal bibasilar crackles ABDOMEN: bs active. nontender to palpation. NEURO: a/o x3.   Significant Diagnostic Tests:  12/24 CXR> no overt edema or opacities. Cardiomegaly, vascular congestion 12/25 CXR> progressive pulm edema suggestive of CHF/volume overload  12/25 echo>  LVEF 60-65%. Mild LVH. Grade one diastolic dysfunction. Normal left atrial size. Degenerative MV. No valvular abnormalities. Moderately elevated PA systolic pressure. IVC <50% varriability suggesting RAP of 52mmHg.  Micro Data:  12/24 Blood culture>>4/4 E.Coli  Antimicrobials:  Vancomycin 12/24>12/24 Rocephin 12/24>>12/27  Labs    CBC Latest Ref Rng & Units 04/21/2019 04/20/2019 04/19/2019  WBC 4.0 - 10.5 K/uL 7.5 7.8 11.3(H)  Hemoglobin 12.0 - 15.0 g/dL 10.4(L) 10.4(L) 10.2(L)  Hematocrit 36.0 - 46.0 % 31.5(L) 31.5(L) 31.7(L)  Platelets 150 - 400 K/uL 168 155 156   BMP Latest Ref Rng & Units 04/20/2019 04/19/2019 04/18/2019  Glucose 70 - 99 mg/dL 166(H) 219(H) 272(H)  BUN 8 -  23 mg/dL 11 20 22   Creatinine 0.44 - 1.00 mg/dL 0.99 1.32(H) 1.40(H)  Sodium 135 - 145 mmol/L 138 136 133(L)  Potassium 3.5 - 5.1 mmol/L 3.2(L) 3.7 4.2  Chloride 98 - 111 mmol/L 99 103 99  CO2 22 - 32 mmol/L 26 21(L) 20(L)  Calcium 8.9 - 10.3 mg/dL 8.5(L) 8.6(L) 8.9    Summary  Nicole Bridges is a 63 yo female with PMH of T2DM, hypothyroidism and hypertension who presented to the [facility] ED on 04/18/19 for altered mental status and was subsequently admitted to IMTS for urosepsis management.  Assessment & Plan:  Active Problems:   Sepsis ([place])   AKI (acute kidney injury) ([place])   E coli bacteremia   Acute pulmonary edema ([place])  Sepsis secondary to pyelonephritis. BC 4/4 for pan-sensative e.coli. UA consistent with UTI. UC with pan-sensitive e.coli. Afebrile >24h Hypokalemia. Likely attributable to diuresis Weakness noted by RN. May be attributable to infection.  Plan rocephin--day #4/10. Will transition to po augmentin at discharge for another 6 days to complete a total of 10d antibiotic course. 55meq K x2 Will have PT consult prior to discharge today.  Acute respiratory failure 2/2 Pulmonary edema. Echo significant for grade 1 diastolic dysfunction and mild LVH. PA systolic pressures elevated--likely in setting of volume overload. Now on RA.  Chronic Hypertension. Home medications: enalapril-hctz 5-12.5mg  daily, hctz 25mg  daily, metoprolol succinate 25mg  daily. Blood pressures still up. Continue hctz. Restart metoprolol 25mg  today.  Chronic T2DM. Home medications: glipizide 10mg  daily, toujeo 70U daily, SSI with humalog, metformin  1000mg  BID, trulicity 1.5mg  weekly. A1C 8.7. blood sugars still fairly elevated. Moderate sliding scale. Increasing lantus to 15U at night.   Chronic OSA. CPAP qHS  Best practice:  CODE STATUS: full Diet: diabetic, cardiac DVT for prophylaxis: lovenox Dispo: can discharge today once cleared by PT  , MD INTERNAL MEDICINE RESIDENT  PGY-1 PAGER #: [phone] 04/21/19 4:44 AM

## 2021-06-21 IMAGING — CT CT HEAD W/O CM
3 series · 14 of 46 positions shown, 16 images · non-contrast
Comparison: None.

CLINICAL DATA: Confusion

EXAM:
CT HEAD WITHOUT CONTRAST
TECHNIQUE: Contiguous axial images were obtained from the base of the skull
through the vertex without intravenous contrast.

[Series 3: head 5.0 h30s · axial · 0.45mm/px · z∈[-83,+37]mm · 8 of 29 slices shown, 10 images]
[im 3/29  brain]
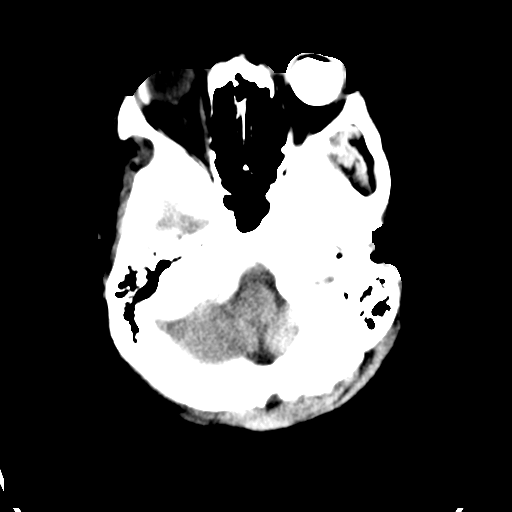
[im 3/29  bone]
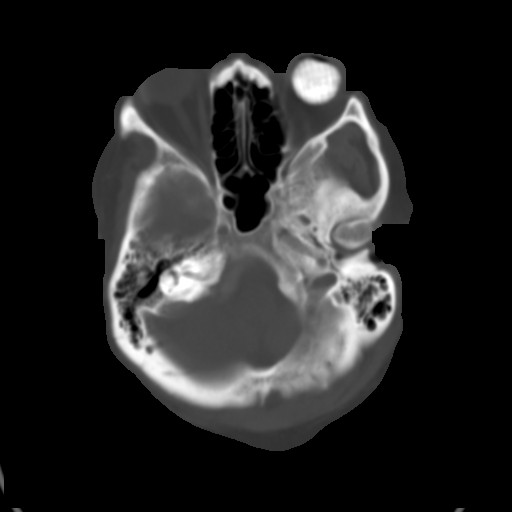
[im 7/29  brain]
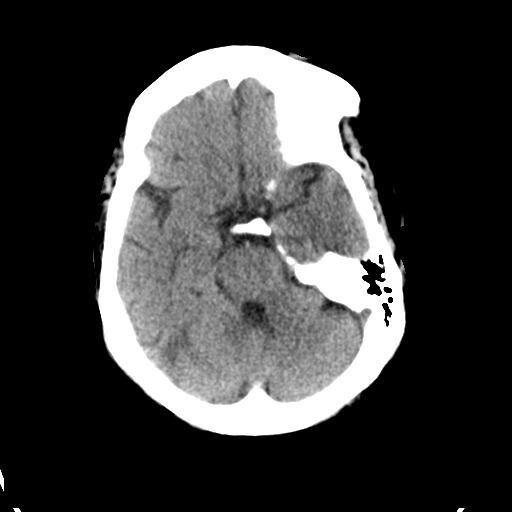
[im 10/29  brain]
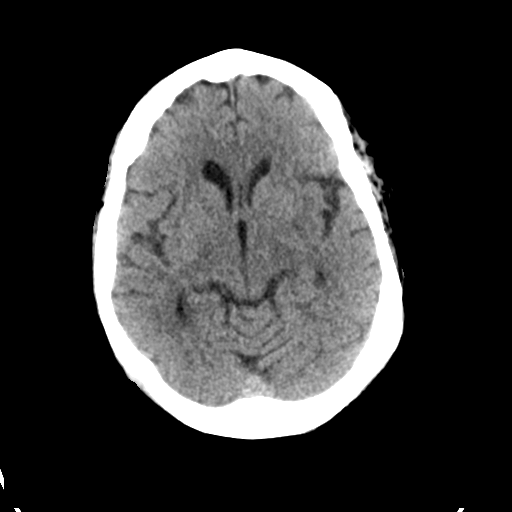
[im 13/29  brain]
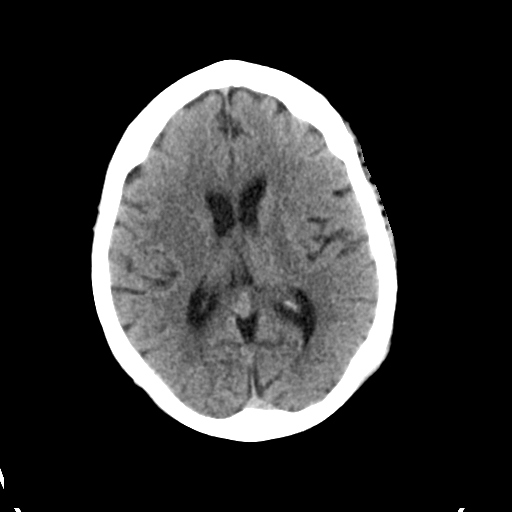
[im 17/29  brain]
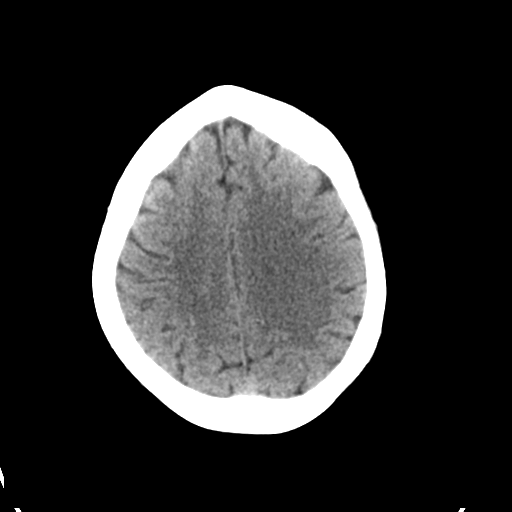
[im 17/29  bone]
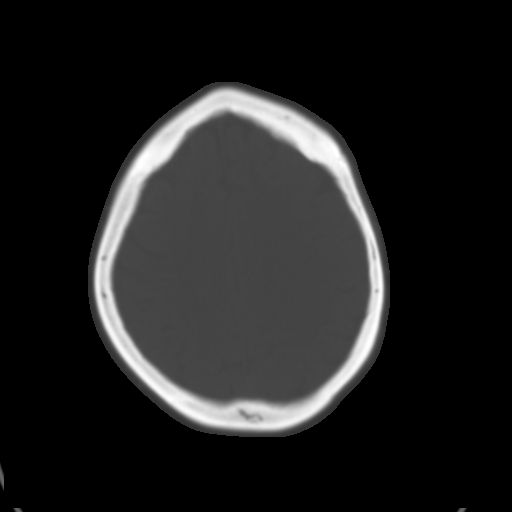
[im 20/29  brain]
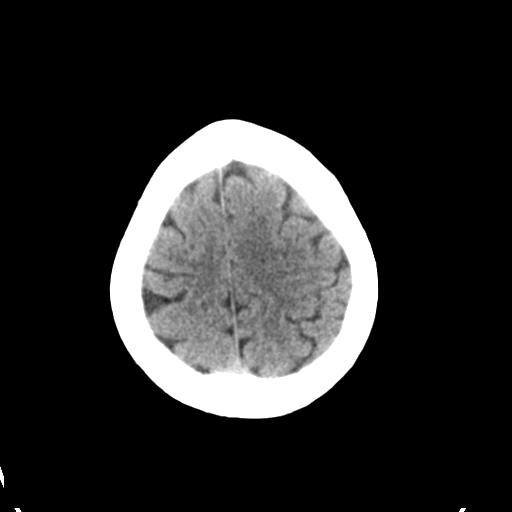
[im 23/29  brain]
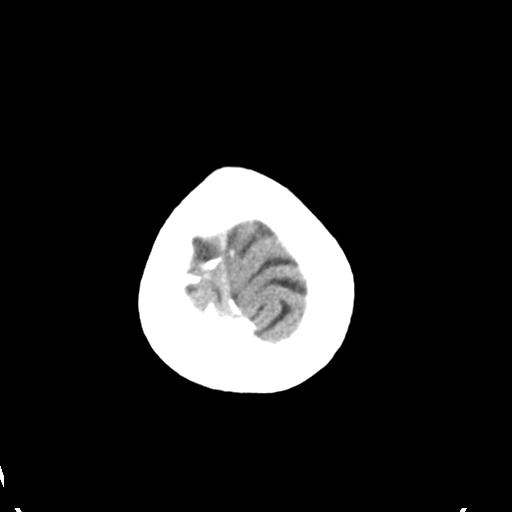
[im 27/29  brain]
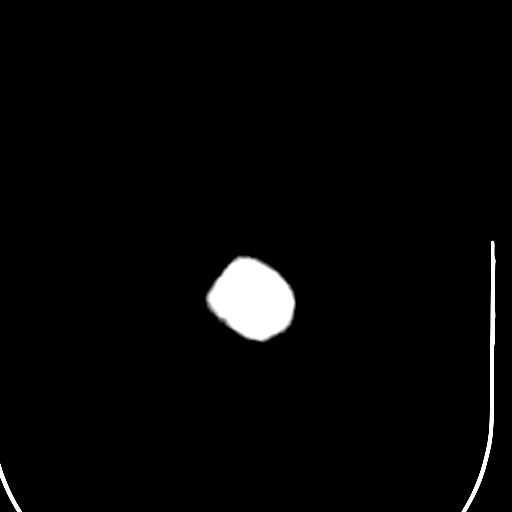

[Series 5: head 3.0 mpr cor · coronal · 0.29mm/px · 3 of 67 slices shown]
[im 23/67  brain]
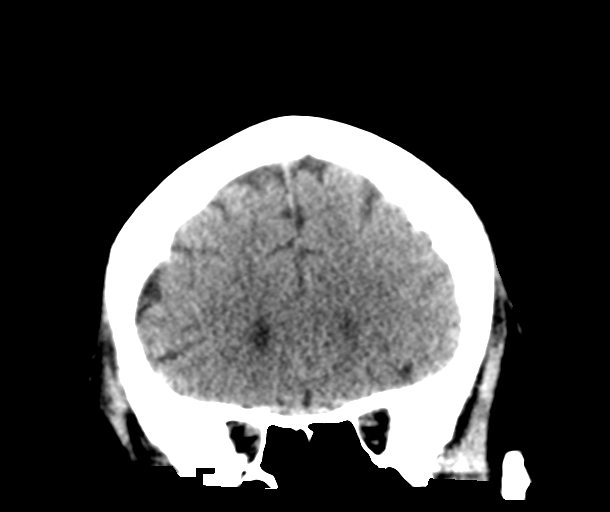
[im 30/67  brain]
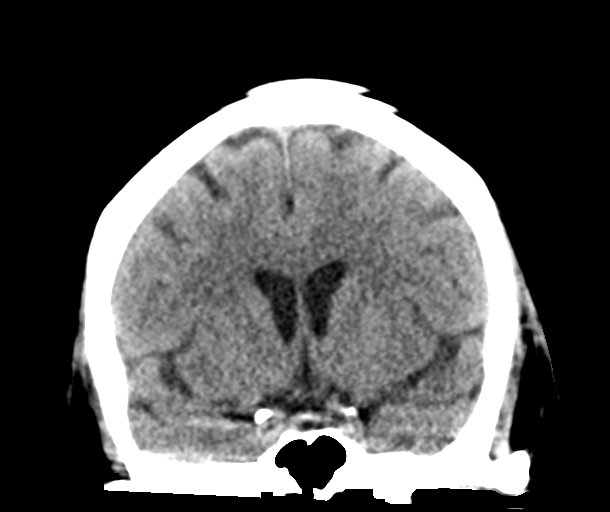
[im 37/67  brain]
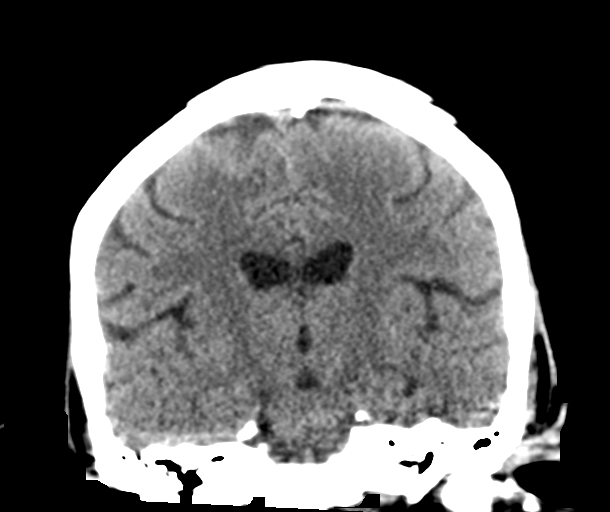

[Series 6: head 3.0 mpr sag · sagittal · 0.28mm/px · 3 of 66 slices shown]
[im 22/66  brain]
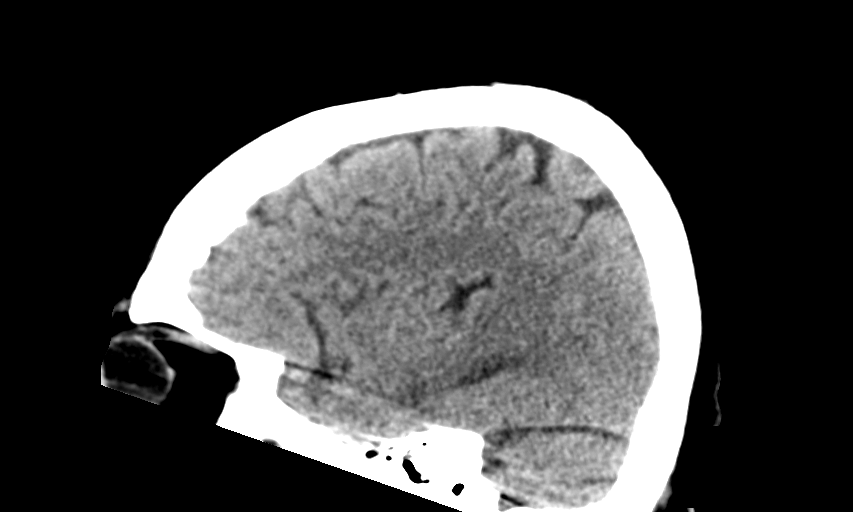
[im 33/66  brain]
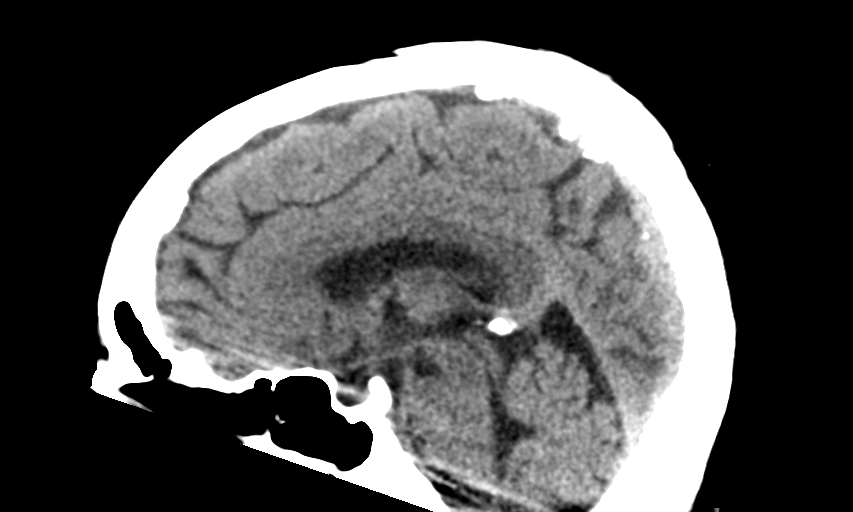
[im 44/66  brain]
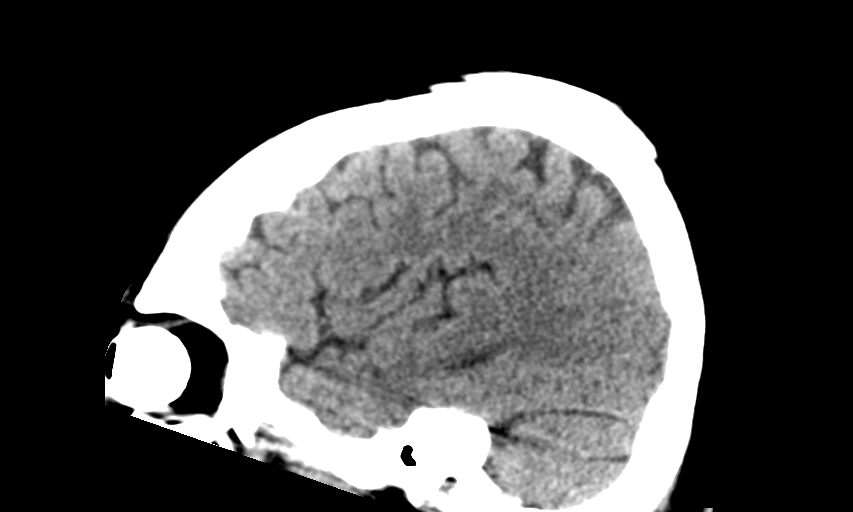

[14 of 46 positions shown; findings below may reference images not displayed]

FINDINGS: Brain: No acute intracranial abnormality. Specifically, no
hemorrhage, hydrocephalus, mass lesion, acute infarction, or
significant intracranial injury.

Vascular: No hyperdense vessel or unexpected calcification.

Skull: No acute calvarial abnormality.

Sinuses/Orbits: Postoperative changes in the left orbit. No acute
findings

Other: None
IMPRESSION: No acute intracranial abnormality.
